# Patient Record
Sex: Female | Born: 1970 | Race: White | Hispanic: No | Marital: Married | Smoking: Former smoker
Health system: Southern US, Community
[De-identification: ages and names within clinical notes are randomized; demographics above are authoritative.]

## PROBLEM LIST (undated history)

## (undated) DIAGNOSIS — Z9889 Other specified postprocedural states: Secondary | ICD-10-CM

## (undated) DIAGNOSIS — R112 Nausea with vomiting, unspecified: Secondary | ICD-10-CM

## (undated) DIAGNOSIS — R51 Headache: Secondary | ICD-10-CM

## (undated) DIAGNOSIS — H8109 Meniere's disease, unspecified ear: Secondary | ICD-10-CM

## (undated) DIAGNOSIS — T8859XA Other complications of anesthesia, initial encounter: Secondary | ICD-10-CM

## (undated) DIAGNOSIS — T4145XA Adverse effect of unspecified anesthetic, initial encounter: Secondary | ICD-10-CM

## (undated) DIAGNOSIS — R519 Headache, unspecified: Secondary | ICD-10-CM

## (undated) DIAGNOSIS — N390 Urinary tract infection, site not specified: Secondary | ICD-10-CM

## (undated) DIAGNOSIS — M1990 Unspecified osteoarthritis, unspecified site: Secondary | ICD-10-CM

## (undated) DIAGNOSIS — R569 Unspecified convulsions: Secondary | ICD-10-CM

## (undated) HISTORY — PX: TONSILLECTOMY: SUR1361

## (undated) HISTORY — PX: OTHER SURGICAL HISTORY: SHX169

## (undated) HISTORY — PX: CERVICAL DISC SURGERY: SHX588

---

## 1998-03-24 ENCOUNTER — Ambulatory Visit (HOSPITAL_COMMUNITY): Admission: RE | Admit: 1998-03-24 | Discharge: 1998-03-24 | Payer: Self-pay | Admitting: Neurology

## 1999-08-28 ENCOUNTER — Other Ambulatory Visit: Admission: RE | Admit: 1999-08-28 | Discharge: 1999-08-28 | Payer: Self-pay | Admitting: Obstetrics and Gynecology

## 1999-10-30 ENCOUNTER — Encounter: Payer: Self-pay | Admitting: Obstetrics and Gynecology

## 1999-10-30 ENCOUNTER — Encounter: Admission: RE | Admit: 1999-10-30 | Discharge: 1999-10-30 | Payer: Self-pay | Admitting: Obstetrics and Gynecology

## 2000-01-03 ENCOUNTER — Encounter: Admission: RE | Admit: 2000-01-03 | Discharge: 2000-04-02 | Payer: Self-pay | Admitting: Obstetrics and Gynecology

## 2000-02-18 ENCOUNTER — Inpatient Hospital Stay (HOSPITAL_COMMUNITY): Admission: AD | Admit: 2000-02-18 | Discharge: 2000-02-18 | Payer: Self-pay | Admitting: Obstetrics and Gynecology

## 2000-03-01 ENCOUNTER — Inpatient Hospital Stay (HOSPITAL_COMMUNITY): Admission: AD | Admit: 2000-03-01 | Discharge: 2000-03-03 | Payer: Self-pay | Admitting: Obstetrics and Gynecology

## 2000-04-14 ENCOUNTER — Other Ambulatory Visit: Admission: RE | Admit: 2000-04-14 | Discharge: 2000-04-14 | Payer: Self-pay | Admitting: Obstetrics and Gynecology

## 2001-06-01 ENCOUNTER — Other Ambulatory Visit: Admission: RE | Admit: 2001-06-01 | Discharge: 2001-06-01 | Payer: Self-pay | Admitting: Obstetrics and Gynecology

## 2001-09-13 ENCOUNTER — Ambulatory Visit (HOSPITAL_COMMUNITY): Admission: RE | Admit: 2001-09-13 | Discharge: 2001-09-13 | Payer: Self-pay | Admitting: Family Medicine

## 2001-09-13 ENCOUNTER — Encounter: Payer: Self-pay | Admitting: Family Medicine

## 2002-11-07 ENCOUNTER — Other Ambulatory Visit: Admission: RE | Admit: 2002-11-07 | Discharge: 2002-11-07 | Payer: Self-pay | Admitting: Obstetrics and Gynecology

## 2002-11-15 ENCOUNTER — Encounter: Payer: Self-pay | Admitting: Thoracic Surgery

## 2002-11-17 ENCOUNTER — Encounter: Payer: Self-pay | Admitting: Thoracic Surgery

## 2002-11-17 ENCOUNTER — Ambulatory Visit (HOSPITAL_COMMUNITY): Admission: RE | Admit: 2002-11-17 | Discharge: 2002-11-19 | Payer: Self-pay | Admitting: Thoracic Surgery

## 2002-11-18 ENCOUNTER — Encounter: Payer: Self-pay | Admitting: Thoracic Surgery

## 2002-11-19 ENCOUNTER — Encounter: Payer: Self-pay | Admitting: Thoracic Surgery

## 2003-12-18 ENCOUNTER — Other Ambulatory Visit: Admission: RE | Admit: 2003-12-18 | Discharge: 2003-12-18 | Payer: Self-pay | Admitting: Obstetrics and Gynecology

## 2004-03-16 ENCOUNTER — Emergency Department (HOSPITAL_COMMUNITY): Admission: EM | Admit: 2004-03-16 | Discharge: 2004-03-16 | Payer: Self-pay | Admitting: Emergency Medicine

## 2006-11-05 ENCOUNTER — Encounter: Admission: RE | Admit: 2006-11-05 | Discharge: 2006-11-05 | Payer: Self-pay | Admitting: Family Medicine

## 2006-11-10 ENCOUNTER — Ambulatory Visit (HOSPITAL_COMMUNITY): Admission: RE | Admit: 2006-11-10 | Discharge: 2006-11-11 | Payer: Self-pay | Admitting: Neurological Surgery

## 2011-12-24 ENCOUNTER — Emergency Department (HOSPITAL_COMMUNITY)
Admission: EM | Admit: 2011-12-24 | Discharge: 2011-12-24 | Disposition: A | Discharge status: Home / Self Care | Payer: Self-pay | Source: Home / Self Care | Attending: Emergency Medicine | Admitting: Emergency Medicine

## 2011-12-24 ENCOUNTER — Encounter (HOSPITAL_COMMUNITY): Payer: Self-pay | Admitting: Emergency Medicine

## 2011-12-24 DIAGNOSIS — R300 Dysuria: Secondary | ICD-10-CM

## 2011-12-24 DIAGNOSIS — N3090 Cystitis, unspecified without hematuria: Secondary | ICD-10-CM

## 2011-12-24 HISTORY — DX: Urinary tract infection, site not specified: N39.0

## 2011-12-24 LAB — POCT URINALYSIS DIP (DEVICE)
Glucose, UA: NEGATIVE mg/dL
Hgb urine dipstick: NEGATIVE
Nitrite: NEGATIVE
Specific Gravity, Urine: >=1.03 (ref 1.005–1.030)

## 2011-12-24 MED ORDER — NITROFURANTOIN MONOHYD MACRO 100 MG PO CAPS: 100.0000 mg | ORAL_CAPSULE | Freq: Two times a day (BID) | ORAL | Status: AC

## 2011-12-24 MED ORDER — PHENAZOPYRIDINE HCL 200 MG PO TABS: 200.0000 mg | ORAL_TABLET | Freq: Three times a day (TID) | ORAL | Status: AC | PRN

## 2011-12-24 NOTE — Discharge Instructions (Signed)
Take the medication as written. Give [place] a working phone number so that we can contact you if needed. Finish the antibiotrics even if you feel better. Return if you get worse, have a fever >100.4, or for any concerns.   Go to www.goodrx.com to look up your medications. This will give you a list of where you can find your prescriptions at the most affordable prices.

## 2011-12-24 NOTE — ED Provider Notes (Signed)
History     CSN: [id number]  Arrival date & time 12/24/11  1804   First MD Initiated Contact with Patient 12/24/11 1847      Chief Complaint  Patient presents with  . Dysuria    (Consider location/radiation/quality/duration/timing/severity/associated sxs/prior treatment) HPI Comments: States her urine smelled stronger than usual. Denies any hematuria, cloudy urine. No vaginal blisters, vaginal discharge. Sexually active with husband, who is asymptomatic. STDs not a concern today. States this feels similar to previous UTIs. Has increased fluid intake and is taking Cystex with temporary relief.  Patient is a 41 y.o. female presenting with dysuria. The history is provided by the patient. No language interpreter was used.  Dysuria  This is a new problem. The current episode started more than 2 days ago. The problem occurs every urination. The problem has not changed since onset.The quality of the pain is described as burning and aching. There has been no fever. She is sexually active. There is no history of pyelonephritis. Associated symptoms include frequency and urgency. Pertinent negatives include no chills, no sweats, no nausea, no vomiting, no discharge, no hematuria, no hesitancy, no possible pregnancy and no flank pain. Treatments tried: Cystex. Her past medical history is significant for recurrent UTIs.    Past Medical History  Diagnosis Date  . UTI (lower urinary tract infection)     Past Surgical History  Procedure Date  . Tonsillectomy   . Partial discectomy   . Cervical disc surgery     History reviewed. No pertinent family history.  History  Substance Use Topics  . Smoking status: Never Smoker   . Smokeless tobacco: Not on file  . Alcohol Use: No    OB History    Grav Para Term Preterm Abortions TAB SAB Ect Mult Living                  Review of Systems  Constitutional: Negative for chills.  Gastrointestinal: Negative for nausea and vomiting.    Genitourinary: Positive for dysuria, urgency and frequency. Negative for hesitancy, hematuria and flank pain.    Allergies  Ceftin; Codeine; Morphine and related; and Sulfa antibiotics  Home Medications   Current Outpatient Rx  Name Route Sig Dispense Refill  . PROGESTERONE MICRONIZED 200 MG PO CAPS Oral Take 200 mg by mouth daily.    Marland Kitchen NITROFURANTOIN MONOHYD MACRO 100 MG PO CAPS Oral Take 1 capsule (100 mg total) by mouth 2 (two) times daily. 10 capsule 0  . PHENAZOPYRIDINE HCL 200 MG PO TABS Oral Take 1 tablet (200 mg total) by mouth 3 (three) times daily as needed for pain. 6 tablet 0    BP 118/71  Pulse 60  Temp(Src) 98.8 F (37.1 C) (Oral)  Resp 14  SpO2 100%  LMP 08/25/2011  Physical Exam  Nursing note and vitals reviewed. Constitutional: She is oriented to person, place, and time. She appears well-developed and well-nourished. No distress.  HENT:  Head: Normocephalic and atraumatic.  Eyes: Conjunctivae and EOM are normal.  Neck: Normal range of motion.  Cardiovascular: Normal rate.   Pulmonary/Chest: Effort normal.  Abdominal: Soft. Normal appearance and bowel sounds are normal. She exhibits no distension. There is tenderness in the suprapubic area. There is no rebound, no guarding and no CVA tenderness.  Musculoskeletal: Normal range of motion.  Neurological: She is alert and oriented to person, place, and time.  Skin: Skin is warm and dry.  Psychiatric: She has a normal mood and affect. Her behavior is normal. Judgment  and thought content normal.    ED Course  Procedures (including critical care time)  Labs Reviewed  POCT URINALYSIS DIP (DEVICE) - Abnormal; Notable for the following:    Bilirubin Urine SMALL (*)    Ketones, ur TRACE (*)    All other components within normal limits  POCT PREGNANCY, URINE  URINE CULTURE   No results found.   1. Cystitis   2. Dysuria    Results for orders placed during the hospital encounter of 12/24/11  POCT  URINALYSIS DIP (DEVICE)      Component Value Range   Glucose, UA NEGATIVE  NEGATIVE (mg/dL)   Bilirubin Urine SMALL (*) NEGATIVE    Ketones, ur TRACE (*) NEGATIVE (mg/dL)   Specific Gravity, Urine >=1.030  1.005 - 1.030    Hgb urine dipstick NEGATIVE  NEGATIVE    pH 5.5  5.0 - 8.0    Protein, ur NEGATIVE  NEGATIVE (mg/dL)   Urobilinogen, UA 0.2  0.0 - 1.0 (mg/dL)   Nitrite NEGATIVE  NEGATIVE    Leukocytes, UA NEGATIVE  NEGATIVE   POCT PREGNANCY, URINE      Component Value Range   Preg Test, Ur NEGATIVE  NEGATIVE       MDM  Udip noted. Patient symptomatic, with suprapubic tenderness, will treat as UTI. Sending off urine culture. Patient to give [place] working phone number so that we can contact her if we need to change her antibiotics. Patient agrees with plan. She is to followup with her primary care physician as needed.  Luiz Blare, MD 12/24/11 2011

## 2011-12-24 NOTE — ED Notes (Signed)
Pt with onset burning urination saturday

## 2011-12-25 LAB — URINE CULTURE
Culture: NO GROWTH
Special Requests: NORMAL

## 2011-12-26 ENCOUNTER — Telehealth (HOSPITAL_COMMUNITY): Payer: Self-pay | Admitting: *Deleted

## 2011-12-26 NOTE — ED Notes (Signed)
Pt called to check on urine culture results.  Urine culture shows no growth.  Pt states she has taken rx provided for her at visit but continues to have urinary discomfort.  Pt advised to come to [facility] or her her PCP for follow up.

## 2013-03-18 ENCOUNTER — Encounter (HOSPITAL_COMMUNITY): Payer: Self-pay | Admitting: Emergency Medicine

## 2013-03-18 ENCOUNTER — Emergency Department (HOSPITAL_COMMUNITY)
Admission: EM | Admit: 2013-03-18 | Discharge: 2013-03-18 | Disposition: A | Discharge status: Home / Self Care | Payer: 59 | Attending: Emergency Medicine | Admitting: Emergency Medicine

## 2013-03-18 DIAGNOSIS — Z79899 Other long term (current) drug therapy: Secondary | ICD-10-CM | POA: Insufficient documentation

## 2013-03-18 DIAGNOSIS — Z87440 Personal history of urinary (tract) infections: Secondary | ICD-10-CM | POA: Insufficient documentation

## 2013-03-18 DIAGNOSIS — H8110 Benign paroxysmal vertigo, unspecified ear: Secondary | ICD-10-CM

## 2013-03-18 DIAGNOSIS — E860 Dehydration: Secondary | ICD-10-CM | POA: Insufficient documentation

## 2013-03-18 DIAGNOSIS — R112 Nausea with vomiting, unspecified: Secondary | ICD-10-CM

## 2013-03-18 DIAGNOSIS — Z3202 Encounter for pregnancy test, result negative: Secondary | ICD-10-CM | POA: Insufficient documentation

## 2013-03-18 LAB — COMPREHENSIVE METABOLIC PANEL
Albumin: 3.6 g/dL (ref 3.5–5.2)
BUN: 12 mg/dL (ref 6–23)
Chloride: 106 mEq/L (ref 96–112)
Creatinine, Ser: 0.83 mg/dL (ref 0.50–1.10)
GFR calc non Af Amer: 87 mL/min — ABNORMAL LOW (ref >90)
Total Bilirubin: 1.3 mg/dL — ABNORMAL HIGH (ref 0.3–1.2)

## 2013-03-18 LAB — URINALYSIS, ROUTINE W REFLEX MICROSCOPIC
Ketones, ur: NEGATIVE mg/dL
Leukocytes, UA: NEGATIVE
Nitrite: NEGATIVE
Protein, ur: NEGATIVE mg/dL
Urobilinogen, UA: 1 mg/dL (ref 0.0–1.0)
pH: 5 (ref 5.0–8.0)

## 2013-03-18 LAB — LIPASE, BLOOD: Lipase: 20 U/L (ref 11–59)

## 2013-03-18 MED ORDER — MECLIZINE HCL 50 MG PO TABS: 50.0000 mg | ORAL_TABLET | Freq: Three times a day (TID) | ORAL | Status: DC | PRN

## 2013-03-18 MED ORDER — SODIUM CHLORIDE 0.9 % IV BOLUS (SEPSIS)
1000.0000 mL | Freq: Once | INTRAVENOUS | Status: AC
  Administered 2013-03-18: 1000 mL via INTRAVENOUS

## 2013-03-18 MED ORDER — ONDANSETRON HCL 4 MG/2ML IJ SOLN
4.0000 mg | Freq: Once | INTRAMUSCULAR | Status: AC
  Administered 2013-03-18: 4 mg via INTRAVENOUS
  Filled 2013-03-18: qty 2

## 2013-03-18 MED ORDER — ONDANSETRON 4 MG PO TBDP
4.0000 mg | ORAL_TABLET | Freq: Once | ORAL | Status: AC
  Administered 2013-03-18: 4 mg via ORAL
  Filled 2013-03-18: qty 1

## 2013-03-18 MED ORDER — ONDANSETRON HCL 4 MG PO TABS: 4.0000 mg | ORAL_TABLET | Freq: Four times a day (QID) | ORAL | Status: DC

## 2013-03-18 MED ORDER — MECLIZINE HCL 25 MG PO TABS
25.0000 mg | ORAL_TABLET | Freq: Once | ORAL | Status: AC
  Administered 2013-03-18: 25 mg via ORAL
  Filled 2013-03-18: qty 1

## 2013-03-18 MED ORDER — LORAZEPAM 2 MG/ML IJ SOLN
1.0000 mg | Freq: Once | INTRAMUSCULAR | Status: AC
  Administered 2013-03-18: 1 mg via INTRAVENOUS
  Filled 2013-03-18: qty 1

## 2013-03-18 NOTE — ED Notes (Signed)
Pt informed we still need a urine sample, pt states she is unable to go at this time.

## 2013-03-18 NOTE — ED Notes (Signed)
Pt given cup of ice chips 

## 2013-03-18 NOTE — ED Provider Notes (Signed)
Pt signed out to me by Dr. Lavella Kemp at shift change, see note.  Pt is a  42yo female, presented with classic vertiginous symptoms.  Tx with meclizine and fluids which provided some relief, pt still symptomatic.  UA is pending.  Pt is getting ativan to help with continued vertiginous symptoms.  Pt is to be discharged home and f/u with ENT once symptoms under control.  UA: nl.  Pt able to ambulate to restroom.  States she still feels some nausea but comfortable going home.  Rx: meclizine and zofran. Will discharge pt home and have her f/u with PCP, Dr. Pollyann Kennedy as needed.  Pt verbalized understanding and agreement with tx plan. Vitals: unremarkable. Discharged in stable condition.    Discussed pt with attending during ED encounter.   Junius Finner, PA-C 03/18/13 1108

## 2013-03-18 NOTE — ED Notes (Signed)
Per EMS pt has had N/V for the past two days. EMS adm a total of 8 mg of zofran ODT. Pt actively vomiting upon arrival to the ED. Pt is A&O.

## 2013-03-18 NOTE — ED Provider Notes (Signed)
History    CSN: [id number] Arrival date & time 03/18/13  0510  First MD Initiated Contact with Patient 03/18/13 0518     Chief Complaint  Patient presents with  . Emesis  . Nausea   (Consider location/radiation/quality/duration/timing/severity/associated sxs/prior Treatment) HPI This patient is a 42 year old woman in generally good health. She presents with 36 hours of severe vertigo associated with nausea and vomiting. The patient says that she is unable to open her eyes without feeling significantly nauseated. She has vomited in innumerable times. Her emesis has been nonbloody and nonbilious. She denies abdominal pain. She denies diarrhea. She had well formed bowel movement about 8 hours prior to arrival. She has a history of abdominal surgeries.  The patient denies headache, fever, tinnitus history of similar symptoms.  The patient's husband describes having observed or horizontal nystagmus at home the Past Medical History  Diagnosis Date  . UTI (lower urinary tract infection)    Past Surgical History  Procedure Laterality Date  . Tonsillectomy    . Partial discectomy    . Cervical disc surgery     No family history on file. History  Substance Use Topics  . Smoking status: Never Smoker   . Smokeless tobacco: Not on file  . Alcohol Use: No   OB History   Grav Para Term Preterm Abortions TAB SAB Ect Mult Living                 Review of Systems Gen: no weight loss, fevers, chills, night sweats Eyes: no discharge or drainage, no occular pain or visual changes Nose: no epistaxis or rhinorrhea Mouth: no dental pain, no sore throat Neck: no neck pain Lungs: no SOB, cough, wheezing CV: no chest pain, palpitations, dependent edema or orthopnea Abd: As per history of present illness, otherwise negative GU: no dysuria or gross hematuria MSK: no myalgias or arthralgias Neuro: As per history of present illness, otherwise negative Skin: no rash Psyche:  negative.  Allergies  Ceftin; Codeine; Morphine and related; and Sulfa antibiotics  Home Medications   Current Outpatient Rx  Name  Route  Sig  Dispense  Refill  . progesterone 50 MG/ML injection   Intramuscular   Inject 50 mg into the muscle every 7 (seven) days. For sundays          BP 103/57  Pulse 78  Resp 17  SpO2 100% Physical Exam Gen: well developed and well nourished appearing, ill appearing, laying still on gurney with eyes closed and seemingly reluctant to move her head Head: NCAT Eyes: PERL, EOMI Nose: no epistaixis or rhinorrhea Mouth/throat: mucosa is moist and pink Neck: supple, no stridor Lungs: CTA B, no wheezing, rhonchi or rales CV: RRR, no murmur Abd: soft, notender, nondistended Back: no ttp, no cva ttp Skin: no rashese, wnl Neuro: CN ii-xii grossly intact, horizontal nystagmus appreciated, no rotational or vertical nystagmus.  no focal deficits Psyche; normal affect,  calm and cooperative.   ED Course  Procedures (including critical care time)  Results for orders placed during the hospital encounter of 03/18/13 (from the past 24 hour(s))  COMPREHENSIVE METABOLIC PANEL     Status: Abnormal   Collection Time    03/18/13  5:30 AM      Result Value Range   Sodium 138  135 - 145 mEq/L   Potassium 3.7  3.5 - 5.1 mEq/L   Chloride 106  96 - 112 mEq/L   CO2 22  19 - 32 mEq/L   Glucose, Bld  166 (*) 70 - 99 mg/dL   BUN 12  6 - 23 mg/dL   Creatinine, Ser 1.61  0.50 - 1.10 mg/dL   Calcium 8.5  8.4 - 09.6 mg/dL   Total Protein 6.6  6.0 - 8.3 g/dL   Albumin 3.6  3.5 - 5.2 g/dL   AST 18  0 - 37 U/L   ALT 14  0 - 35 U/L   Alkaline Phosphatase 64  39 - 117 U/L   Total Bilirubin 1.3 (*) 0.3 - 1.2 mg/dL   GFR calc non Af Amer 87 (*) >90 mL/min   GFR calc Af Amer >90  >90 mL/min  LIPASE, BLOOD     Status: None   Collection Time    03/18/13  5:30 AM      Result Value Range   Lipase 20  11 - 59 U/L     MDM  Patient presents with sx consistent with  BPV.   She is feeling somewhat improved and is able to open her eyes without feeling ill after tx with 1st liter of NS and Meclizine 25mg  po. However, she says she still has vertigo. We will tx with Ativan and a 2nd liter of NS. Patient will then be re-evaluated for disposition with plan to refer to ENT for outpatient f/u.   Brandt Loosen, MD 03/18/13 [phone]

## 2013-03-20 NOTE — ED Provider Notes (Signed)
Medical screening examination/treatment/procedure(s) were performed by non-physician practitioner and as supervising physician I was immediately available for consultation/collaboration.   Brandt Loosen, MD 03/20/13 [phone]

## 2013-03-31 ENCOUNTER — Other Ambulatory Visit: Payer: Self-pay | Admitting: Physician Assistant

## 2013-03-31 DIAGNOSIS — R42 Dizziness and giddiness: Secondary | ICD-10-CM

## 2013-04-01 ENCOUNTER — Ambulatory Visit
Admission: RE | Admit: 2013-04-01 | Discharge: 2013-04-01 | Disposition: A | Discharge status: Home / Self Care | Payer: 59 | Source: Ambulatory Visit | Attending: Physician Assistant | Admitting: Physician Assistant

## 2013-04-01 DIAGNOSIS — R42 Dizziness and giddiness: Secondary | ICD-10-CM

## 2013-04-01 MED ORDER — GADOBENATE DIMEGLUMINE 529 MG/ML IV SOLN
17.0000 mL | Freq: Once | INTRAVENOUS | Status: AC | PRN
  Administered 2013-04-01: 17 mL via INTRAVENOUS

## 2013-09-20 ENCOUNTER — Other Ambulatory Visit: Payer: Self-pay

## 2013-09-20 DIAGNOSIS — Z1231 Encounter for screening mammogram for malignant neoplasm of breast: Secondary | ICD-10-CM

## 2013-10-03 ENCOUNTER — Ambulatory Visit
Admission: RE | Admit: 2013-10-03 | Discharge: 2013-10-03 | Disposition: A | Discharge status: Home / Self Care | Payer: 59 | Source: Ambulatory Visit

## 2013-10-03 DIAGNOSIS — Z1231 Encounter for screening mammogram for malignant neoplasm of breast: Secondary | ICD-10-CM

## 2015-10-05 ENCOUNTER — Ambulatory Visit: Payer: Self-pay | Admitting: Physician Assistant

## 2015-10-22 ENCOUNTER — Other Ambulatory Visit (HOSPITAL_COMMUNITY): Payer: Self-pay | Admitting: *Deleted

## 2015-10-22 NOTE — Pre-Procedure Instructions (Signed)
    KENNY STERN  10/22/2015      [id number] [id number]:: [phone]:: [phone]   Your procedure is scheduled on Wednesday October 31, 2015.  [facility]  Call this number if you have problems the morning of surgery:  [phone]   Remember:  Do not eat food or drink liquids after midnight.  Take these medicines the morning of surgery with A SIP OF WATER: cyclobenzaprine (Flexeril), fluticasone (Flonase), tramadol (Ultram) if needed.   Do not wear jewelry, make-up or nail polish.  Do not wear lotions, powders, or perfumes.  You may not wear deodorant.  Do not shave 48 hours prior to surgery.    Do not bring valuables to the hospital.  [facility] belongings or [facility]ables.  Contacts, dentures or bridgework may not be worn into surgery.  Leave your suitcase in the car.  After surgery it may be brought to your room.  For patients admitted to the hospital, discharge time will be determined by your treatment team.  Patients discharged the day of surgery will not be allowed to drive home.    Special instructions:  Please see attached CHG shower instructions sheet.  Please read over the following fact sheets that you were given. Pain Booklet, Coughing and Deep Breathing, MRSA Information and Surgical Site Infection Prevention

## 2015-10-23 ENCOUNTER — Encounter (HOSPITAL_COMMUNITY): Payer: Self-pay

## 2015-10-23 ENCOUNTER — Encounter (HOSPITAL_COMMUNITY)
Admission: RE | Admit: 2015-10-23 | Discharge: 2015-10-23 | Disposition: A | Discharge status: Home / Self Care | Payer: 59 | Source: Ambulatory Visit | Attending: Orthopedic Surgery | Admitting: Orthopedic Surgery

## 2015-10-23 DIAGNOSIS — Z01818 Encounter for other preprocedural examination: Secondary | ICD-10-CM | POA: Diagnosis present

## 2015-10-23 DIAGNOSIS — Z01812 Encounter for preprocedural laboratory examination: Secondary | ICD-10-CM | POA: Insufficient documentation

## 2015-10-23 DIAGNOSIS — M50221 Other cervical disc displacement at C4-C5 level: Secondary | ICD-10-CM | POA: Diagnosis not present

## 2015-10-23 HISTORY — DX: Unspecified convulsions: R56.9

## 2015-10-23 LAB — CBC
HCT: 41.6 % (ref 36.0–46.0)
Hemoglobin: 14.4 g/dL (ref 12.0–15.0)
MCH: 32.1 pg (ref 26.0–34.0)
MCHC: 34.6 g/dL (ref 30.0–36.0)
MCV: 92.7 fL (ref 78.0–100.0)
PLATELETS: 221 10*3/uL (ref 150–400)
RBC: 4.49 MIL/uL (ref 3.87–5.11)
RDW: 12.3 % (ref 11.5–15.5)
WBC: 5.9 10*3/uL (ref 4.0–10.5)

## 2015-10-23 LAB — SURGICAL PCR SCREEN
MRSA, PCR: NEGATIVE
Staphylococcus aureus: NEGATIVE

## 2015-10-23 LAB — HCG, SERUM, QUALITATIVE: Preg, Serum: NEGATIVE

## 2015-10-24 NOTE — Progress Notes (Signed)
Anesthesia Chart Review:  Pt is a 45 year old female scheduled for C4-5 anterior cervical disc arthroplasty on 10/31/2015 with Dr. Rolena Infante.   PMH includes:  Seizures (once many years ago). Never smoker. BMI 29  Preoperative labs reviewed.    Chart reviewed per anesthesia consult order from Dr. Rolena Infante.   If no changes, I anticipate pt can proceed with surgery as scheduled.   Willeen Cass, FNP-BC [facility]/Anesthesiology Phone: [phone] 10/24/2015 11:13 AM

## 2015-10-31 ENCOUNTER — Ambulatory Visit (HOSPITAL_COMMUNITY): Payer: 59

## 2015-10-31 ENCOUNTER — Ambulatory Visit (HOSPITAL_COMMUNITY): Payer: 59 | Admitting: Emergency Medicine

## 2015-10-31 ENCOUNTER — Encounter (HOSPITAL_COMMUNITY): Payer: Self-pay | Admitting: Anesthesiology

## 2015-10-31 ENCOUNTER — Ambulatory Visit (HOSPITAL_COMMUNITY): Payer: 59 | Admitting: Anesthesiology

## 2015-10-31 ENCOUNTER — Observation Stay (HOSPITAL_COMMUNITY): Payer: 59

## 2015-10-31 ENCOUNTER — Encounter (HOSPITAL_COMMUNITY)
Admission: RE | Disposition: A | Discharge status: Home / Self Care | Payer: Self-pay | Source: Ambulatory Visit | Attending: Orthopedic Surgery

## 2015-10-31 ENCOUNTER — Observation Stay (HOSPITAL_COMMUNITY)
Admission: RE | Admit: 2015-10-31 | Discharge: 2015-11-01 | Disposition: A | Discharge status: Home / Self Care | Payer: 59 | Source: Ambulatory Visit | Attending: Orthopedic Surgery | Admitting: Orthopedic Surgery

## 2015-10-31 DIAGNOSIS — Z882 Allergy status to sulfonamides status: Secondary | ICD-10-CM | POA: Diagnosis not present

## 2015-10-31 DIAGNOSIS — R569 Unspecified convulsions: Secondary | ICD-10-CM | POA: Insufficient documentation

## 2015-10-31 DIAGNOSIS — M542 Cervicalgia: Secondary | ICD-10-CM | POA: Diagnosis present

## 2015-10-31 DIAGNOSIS — G43909 Migraine, unspecified, not intractable, without status migrainosus: Secondary | ICD-10-CM | POA: Insufficient documentation

## 2015-10-31 DIAGNOSIS — Z9889 Other specified postprocedural states: Secondary | ICD-10-CM

## 2015-10-31 DIAGNOSIS — M50121 Cervical disc disorder at C4-C5 level with radiculopathy: Principal | ICD-10-CM | POA: Insufficient documentation

## 2015-10-31 DIAGNOSIS — Z888 Allergy status to other drugs, medicaments and biological substances status: Secondary | ICD-10-CM | POA: Diagnosis not present

## 2015-10-31 DIAGNOSIS — Z885 Allergy status to narcotic agent status: Secondary | ICD-10-CM | POA: Diagnosis not present

## 2015-10-31 DIAGNOSIS — Z419 Encounter for procedure for purposes other than remedying health state, unspecified: Secondary | ICD-10-CM

## 2015-10-31 DIAGNOSIS — Z87891 Personal history of nicotine dependence: Secondary | ICD-10-CM | POA: Diagnosis not present

## 2015-10-31 HISTORY — PX: CERVICAL DISC ARTHROPLASTY: SHX587

## 2015-10-31 SURGERY — CERVICAL ANTERIOR DISC ARTHROPLASTY
Anesthesia: General | Laterality: Right

## 2015-10-31 MED ORDER — LACTATED RINGERS IV SOLN: INTRAVENOUS | Status: DC

## 2015-10-31 MED ORDER — LACTATED RINGERS IV SOLN
INTRAVENOUS | Status: DC | PRN
  Administered 2015-10-31 (×2): via INTRAVENOUS

## 2015-10-31 MED ORDER — ONDANSETRON HCL 4 MG/2ML IJ SOLN: 4.0000 mg | INTRAMUSCULAR | Status: DC | PRN

## 2015-10-31 MED ORDER — OXYCODONE HCL 5 MG PO TABS: 10.0000 mg | ORAL_TABLET | ORAL | Status: DC | PRN

## 2015-10-31 MED ORDER — ONDANSETRON HCL 4 MG PO TABS: 4.0000 mg | ORAL_TABLET | Freq: Three times a day (TID) | ORAL | Status: DC | PRN

## 2015-10-31 MED ORDER — BUPIVACAINE-EPINEPHRINE (PF) 0.25% -1:200000 IJ SOLN
INTRAMUSCULAR | Status: AC
  Filled 2015-10-31: qty 30

## 2015-10-31 MED ORDER — ROCURONIUM BROMIDE 50 MG/5ML IV SOLN
INTRAVENOUS | Status: AC
  Filled 2015-10-31: qty 1

## 2015-10-31 MED ORDER — PHENOL 1.4 % MT LIQD: 1.0000 | OROMUCOSAL | Status: DC | PRN

## 2015-10-31 MED ORDER — VANCOMYCIN HCL IN DEXTROSE 1-5 GM/200ML-% IV SOLN
INTRAVENOUS | Status: AC
  Filled 2015-10-31: qty 200

## 2015-10-31 MED ORDER — SODIUM CHLORIDE 0.9 % IV SOLN: 250.0000 mL | INTRAVENOUS | Status: DC

## 2015-10-31 MED ORDER — HYDROMORPHONE HCL 1 MG/ML IJ SOLN: 1.0000 mg | INTRAMUSCULAR | Status: DC | PRN

## 2015-10-31 MED ORDER — DIPHENHYDRAMINE HCL 25 MG PO CAPS
25.0000 mg | ORAL_CAPSULE | ORAL | Status: DC | PRN
Start: 2015-10-31 — End: 2015-11-01

## 2015-10-31 MED ORDER — FENTANYL CITRATE (PF) 250 MCG/5ML IJ SOLN
INTRAMUSCULAR | Status: AC
  Filled 2015-10-31: qty 5

## 2015-10-31 MED ORDER — DEXAMETHASONE SODIUM PHOSPHATE 4 MG/ML IJ SOLN: 4.0000 mg | Freq: Four times a day (QID) | INTRAMUSCULAR | Status: DC

## 2015-10-31 MED ORDER — OXYCODONE-ACETAMINOPHEN 5-325 MG PO TABS
1.0000 | ORAL_TABLET | ORAL | Status: DC | PRN
  Administered 2015-10-31 – 2015-11-01 (×4): 2 via ORAL
  Filled 2015-10-31 (×4): qty 2

## 2015-10-31 MED ORDER — MIDAZOLAM HCL 5 MG/5ML IJ SOLN
INTRAMUSCULAR | Status: DC | PRN
  Administered 2015-10-31: 2 mg via INTRAVENOUS

## 2015-10-31 MED ORDER — HEMOSTATIC AGENTS (NO CHARGE) OPTIME
TOPICAL | Status: DC | PRN
  Administered 2015-10-31 (×2): 1 via TOPICAL

## 2015-10-31 MED ORDER — OXYCODONE-ACETAMINOPHEN 10-325 MG PO TABS: 1.0000 | ORAL_TABLET | ORAL | Status: DC | PRN

## 2015-10-31 MED ORDER — PROPOFOL 10 MG/ML IV BOLUS
INTRAVENOUS | Status: AC
  Filled 2015-10-31: qty 40

## 2015-10-31 MED ORDER — HYDROMORPHONE HCL 1 MG/ML IJ SOLN
0.2500 mg | INTRAMUSCULAR | Status: DC | PRN
  Administered 2015-10-31: 0.5 mg via INTRAVENOUS
  Administered 2015-10-31 (×2): 0.25 mg via INTRAVENOUS
  Administered 2015-10-31: 0.5 mg via INTRAVENOUS

## 2015-10-31 MED ORDER — VANCOMYCIN HCL IN DEXTROSE 1-5 GM/200ML-% IV SOLN
1000.0000 mg | INTRAVENOUS | Status: AC
  Administered 2015-10-31: 1000 mg via INTRAVENOUS

## 2015-10-31 MED ORDER — MENTHOL 3 MG MT LOZG
1.0000 | LOZENGE | OROMUCOSAL | Status: DC | PRN
  Filled 2015-10-31: qty 9

## 2015-10-31 MED ORDER — ONDANSETRON HCL 4 MG/2ML IJ SOLN: 4.0000 mg | Freq: Once | INTRAMUSCULAR | Status: DC | PRN

## 2015-10-31 MED ORDER — LIDOCAINE HCL (CARDIAC) 20 MG/ML IV SOLN
INTRAVENOUS | Status: DC | PRN
  Administered 2015-10-31: 100 mg via INTRAVENOUS

## 2015-10-31 MED ORDER — ASPIRIN EC 81 MG PO TBEC: 81.0000 mg | DELAYED_RELEASE_TABLET | Freq: Every day | ORAL | Status: DC

## 2015-10-31 MED ORDER — MEPERIDINE HCL 25 MG/ML IJ SOLN: 6.2500 mg | INTRAMUSCULAR | Status: DC | PRN

## 2015-10-31 MED ORDER — ONDANSETRON HCL 4 MG/2ML IJ SOLN
INTRAMUSCULAR | Status: AC
  Filled 2015-10-31: qty 2

## 2015-10-31 MED ORDER — SUGAMMADEX SODIUM 200 MG/2ML IV SOLN
INTRAVENOUS | Status: DC | PRN
  Administered 2015-10-31: 200 mg via INTRAVENOUS

## 2015-10-31 MED ORDER — PROGESTERONE MICRONIZED 100 MG PO CAPS
100.0000 mg | ORAL_CAPSULE | Freq: Every day | ORAL | Status: DC
  Administered 2015-10-31: 100 mg via ORAL
  Filled 2015-10-31: qty 1

## 2015-10-31 MED ORDER — MIDAZOLAM HCL 2 MG/2ML IJ SOLN
INTRAMUSCULAR | Status: AC
  Filled 2015-10-31: qty 2

## 2015-10-31 MED ORDER — BUPIVACAINE-EPINEPHRINE 0.25% -1:200000 IJ SOLN
INTRAMUSCULAR | Status: DC | PRN
  Administered 2015-10-31: 7 mL

## 2015-10-31 MED ORDER — ROCURONIUM BROMIDE 100 MG/10ML IV SOLN
INTRAVENOUS | Status: DC | PRN
  Administered 2015-10-31: 50 mg via INTRAVENOUS

## 2015-10-31 MED ORDER — TRAMADOL HCL 50 MG PO TABS
50.0000 mg | ORAL_TABLET | Freq: Four times a day (QID) | ORAL | Status: DC | PRN
Start: 2015-10-31 — End: 2015-11-01

## 2015-10-31 MED ORDER — SODIUM CHLORIDE 0.9% FLUSH
3.0000 mL | Freq: Two times a day (BID) | INTRAVENOUS | Status: DC
  Administered 2015-10-31: 3 mL via INTRAVENOUS

## 2015-10-31 MED ORDER — PROPOFOL 10 MG/ML IV BOLUS
INTRAVENOUS | Status: DC | PRN
  Administered 2015-10-31: 200 mg via INTRAVENOUS

## 2015-10-31 MED ORDER — TRAMADOL HCL 50 MG PO TABS
50.0000 mg | ORAL_TABLET | Freq: Four times a day (QID) | ORAL | Status: DC
  Administered 2015-10-31: 50 mg via ORAL
  Filled 2015-10-31: qty 1

## 2015-10-31 MED ORDER — 0.9 % SODIUM CHLORIDE (POUR BTL) OPTIME
TOPICAL | Status: DC | PRN
  Administered 2015-10-31: 1000 mL

## 2015-10-31 MED ORDER — SUGAMMADEX SODIUM 200 MG/2ML IV SOLN
INTRAVENOUS | Status: AC
  Filled 2015-10-31: qty 2

## 2015-10-31 MED ORDER — THROMBIN 20000 UNITS EX SOLR
CUTANEOUS | Status: AC
  Filled 2015-10-31: qty 20000

## 2015-10-31 MED ORDER — METHOCARBAMOL 500 MG PO TABS: 500.0000 mg | ORAL_TABLET | Freq: Three times a day (TID) | ORAL | Status: DC | PRN

## 2015-10-31 MED ORDER — EPHEDRINE SULFATE 50 MG/ML IJ SOLN
INTRAMUSCULAR | Status: DC | PRN
  Administered 2015-10-31: 10 mg via INTRAVENOUS

## 2015-10-31 MED ORDER — DEXAMETHASONE SODIUM PHOSPHATE 10 MG/ML IJ SOLN
INTRAMUSCULAR | Status: AC
  Filled 2015-10-31: qty 1

## 2015-10-31 MED ORDER — THROMBIN 20000 UNITS EX SOLR
CUTANEOUS | Status: DC | PRN
  Administered 2015-10-31: 20000 [IU] via TOPICAL

## 2015-10-31 MED ORDER — METHOCARBAMOL 500 MG PO TABS
500.0000 mg | ORAL_TABLET | Freq: Four times a day (QID) | ORAL | Status: DC | PRN
  Administered 2015-10-31 – 2015-11-01 (×2): 500 mg via ORAL
  Filled 2015-10-31 (×3): qty 1

## 2015-10-31 MED ORDER — VANCOMYCIN HCL IN DEXTROSE 1-5 GM/200ML-% IV SOLN
1000.0000 mg | Freq: Once | INTRAVENOUS | Status: AC
Start: 2015-10-31 — End: 2015-10-31
  Administered 2015-10-31: 1000 mg via INTRAVENOUS
  Filled 2015-10-31: qty 200

## 2015-10-31 MED ORDER — DEXAMETHASONE 4 MG PO TABS
4.0000 mg | ORAL_TABLET | Freq: Four times a day (QID) | ORAL | Status: DC
  Administered 2015-10-31 – 2015-11-01 (×4): 4 mg via ORAL
  Filled 2015-10-31 (×4): qty 1

## 2015-10-31 MED ORDER — FENTANYL CITRATE (PF) 100 MCG/2ML IJ SOLN
INTRAMUSCULAR | Status: DC | PRN
  Administered 2015-10-31 (×2): 50 ug via INTRAVENOUS
  Administered 2015-10-31: 100 ug via INTRAVENOUS
  Administered 2015-10-31 (×3): 50 ug via INTRAVENOUS

## 2015-10-31 MED ORDER — SODIUM CHLORIDE 0.9% FLUSH: 3.0000 mL | INTRAVENOUS | Status: DC | PRN

## 2015-10-31 MED ORDER — METHOCARBAMOL 1000 MG/10ML IJ SOLN
500.0000 mg | Freq: Four times a day (QID) | INTRAVENOUS | Status: DC | PRN
  Administered 2015-10-31: 500 mg via INTRAVENOUS
  Filled 2015-10-31 (×2): qty 5

## 2015-10-31 MED ORDER — ONDANSETRON HCL 4 MG/2ML IJ SOLN
INTRAMUSCULAR | Status: DC | PRN
  Administered 2015-10-31: 4 mg via INTRAVENOUS

## 2015-10-31 MED ORDER — DEXAMETHASONE SODIUM PHOSPHATE 4 MG/ML IJ SOLN
INTRAMUSCULAR | Status: DC | PRN
  Administered 2015-10-31: 10 mg via INTRAVENOUS

## 2015-10-31 MED ORDER — HYDROMORPHONE HCL 1 MG/ML IJ SOLN
INTRAMUSCULAR | Status: AC
  Filled 2015-10-31: qty 1

## 2015-10-31 SURGICAL SUPPLY — 59 items
APL SKNCLS STERI-STRIP NONHPOA (GAUZE/BANDAGES/DRESSINGS) ×1
BENZOIN TINCTURE PRP APPL 2/3 (GAUZE/BANDAGES/DRESSINGS) ×1 IMPLANT
BIT MILLING PRODISC 2.0 STER (BIT) ×1 IMPLANT
BLADE SURG ROTATE 9660 (MISCELLANEOUS) IMPLANT
BUR EGG ELITE 4.0 (BURR) IMPLANT
BUR MATCHSTICK NEURO 3.0 LAGG (BURR) IMPLANT
CANISTER SUCTION 2500CC (MISCELLANEOUS) ×2 IMPLANT
CLSR STERI-STRIP ANTIMIC 1/2X4 (GAUZE/BANDAGES/DRESSINGS) ×2 IMPLANT
COVER MAYO STAND STRL (DRAPES) ×4 IMPLANT
COVER SURGICAL LIGHT HANDLE (MISCELLANEOUS) ×4 IMPLANT
CRADLE DONUT ADULT HEAD (MISCELLANEOUS) ×2 IMPLANT
DISC PRODISC-C MED DEEP 6MM (Neuro Prosthesis/Implant) ×1 IMPLANT
DRAPE C-ARM 42X72 X-RAY (DRAPES) ×2 IMPLANT
DRAPE C-ARMOR (DRAPES) ×2 IMPLANT
DRAPE POUCH INSTRU U-SHP 10X18 (DRAPES) ×2 IMPLANT
DRAPE SURG 17X23 STRL (DRAPES) ×2 IMPLANT
DRAPE U-SHAPE 47X51 STRL (DRAPES) ×2 IMPLANT
DRSG MEPILEX BORDER 4X4 (GAUZE/BANDAGES/DRESSINGS) ×2 IMPLANT
DURAPREP 6ML APPLICATOR 50/CS (WOUND CARE) ×2 IMPLANT
ELECT COATED BLADE 2.86 ST (ELECTRODE) ×2 IMPLANT
ELECT PENCIL ROCKER SW 15FT (MISCELLANEOUS) ×2 IMPLANT
ELECT REM PT RETURN 9FT ADLT (ELECTROSURGICAL) ×2
ELECTRODE REM PT RTRN 9FT ADLT (ELECTROSURGICAL) ×1 IMPLANT
GLOVE BIO SURGEON STRL SZ 6.5 (GLOVE) ×2 IMPLANT
GLOVE BIOGEL PI IND STRL 6.5 (GLOVE) ×1 IMPLANT
GLOVE BIOGEL PI IND STRL 8.5 (GLOVE) ×1 IMPLANT
GLOVE BIOGEL PI INDICATOR 6.5 (GLOVE) ×3
GLOVE BIOGEL PI INDICATOR 8.5 (GLOVE) ×1
GLOVE SS BIOGEL STRL SZ 8.5 (GLOVE) ×1 IMPLANT
GLOVE SUPERSENSE BIOGEL SZ 8.5 (GLOVE) ×2
GOWN STRL REUS W/ TWL LRG LVL3 (GOWN DISPOSABLE) ×1 IMPLANT
GOWN STRL REUS W/TWL 2XL LVL3 (GOWN DISPOSABLE) ×4 IMPLANT
GOWN STRL REUS W/TWL LRG LVL3 (GOWN DISPOSABLE) ×2
KIT BASIN OR (CUSTOM PROCEDURE TRAY) ×2 IMPLANT
KIT ROOM TURNOVER OR (KITS) ×2 IMPLANT
NDL SPNL 18GX3.5 QUINCKE PK (NEEDLE) ×1 IMPLANT
NEEDLE SPNL 18GX3.5 QUINCKE PK (NEEDLE) ×2 IMPLANT
NS IRRIG 1000ML POUR BTL (IV SOLUTION) ×2 IMPLANT
PACK ORTHO CERVICAL (CUSTOM PROCEDURE TRAY) ×2 IMPLANT
PACK UNIVERSAL I (CUSTOM PROCEDURE TRAY) ×2 IMPLANT
PAD ARMBOARD 7.5X6 YLW CONV (MISCELLANEOUS) ×4 IMPLANT
PATTIES SURGICAL .25X.25 (GAUZE/BANDAGES/DRESSINGS) ×1 IMPLANT
RESTRAINT LIMB HOLDER UNIV (RESTRAINTS) ×2 IMPLANT
SPONGE INTESTINAL PEANUT (DISPOSABLE) IMPLANT
SPONGE SURGIFOAM ABS GEL 100 (HEMOSTASIS) ×2 IMPLANT
SURGIFLO W/THROMBIN 8M KIT (HEMOSTASIS) ×2 IMPLANT
SUT BONE WAX W31G (SUTURE) ×2 IMPLANT
SUT MNCRL AB 3-0 PS2 27 (SUTURE) ×2 IMPLANT
SUT SILK 3 0 TIES 17X18 (SUTURE) ×2
SUT SILK 3-0 18XBRD TIE BLK (SUTURE) ×1 IMPLANT
SUT VIC AB 2-0 CT1 18 (SUTURE) ×2 IMPLANT
SYR BULB IRRIGATION 50ML (SYRINGE) ×2 IMPLANT
SYR CONTROL 10ML LL (SYRINGE) IMPLANT
TAPE CLOTH 4X10 WHT NS (GAUZE/BANDAGES/DRESSINGS) ×2 IMPLANT
TAPE UMBILICAL COTTON 1/8X30 (MISCELLANEOUS) ×2 IMPLANT
TIP INSERTER MED DEEP IMPL 16 (Orthopedic Implant) ×1 IMPLANT
TOWEL OR 17X24 6PK STRL BLUE (TOWEL DISPOSABLE) ×2 IMPLANT
TOWEL OR 17X26 10 PK STRL BLUE (TOWEL DISPOSABLE) ×2 IMPLANT
WATER STERILE IRR 1000ML POUR (IV SOLUTION) ×2 IMPLANT

## 2015-10-31 NOTE — Discharge Instructions (Signed)
Today you will be discharged from the hospital.  The purpose of the following handout is to help guide you over the next 2 weeks.  First and foremost, be sure you have a follow up appointment with Dr. Rolena Infante 2 weeks from the time of your surgery to have your sutures removed.  Please call [place] [phone] to schedule or confirm this appointment.      Brace You do not have to wear the collar while lying in bed or sitting in a high-backed chair, eating, sleeping or showering.  Other than these instances, you must wear the brace.  You may NOT wear the collar while driving a vehicle (see driving restrictions below).  It is advisable that you wear the collar in public places or while traveling in a car as a passenger.  Dr. Rolena Infante will discuss further use of the collar at your 2 week postop visit.  Wound Care You may SHOWER 5 days from the date of surgery.  Shower directly over the steri-strips.  DO NOT scrub or submerge (bath tub, swimming pool, hot tub, etc.) the area.  Pat to dry following your shower.  There is no need for additional dressings other than the steri-strips.  Allow the steri-strips to fall off on their own.  Once the strips have fallen off, you may leave the area undressed.  DO NOT apply lotion/cream/ointment to the area.  The wound must remain dry at all times other than while showering.  Dr. Rolena Infante or his staff will remove your stiches at your first postop visit and give you additional instructions regarding wound care at that time.   Activity NO DRIVING FOR 2 WEEKS.  No lifting over 5 pounds (approximately a gallon of milk).  No bending, stooping, squatting or twisting.  No overhead activities.  We encourage you to walk (short distances and often throughout the day) as you can tolerate.  A good rule of thumb is to get up and move once or twice every hour.  You may go up and down stairs carefully.  As you continue to recover, Dr. Rolena Infante will address and  adjust restrictions to your activities until no further restrictions are needed.  However, until your first postop visit, when Dr. Rolena Infante can assess your recovery, you are to follow these instructions.  At the end of this document is a tentative outline of activities for up to 1 year.       Medication You will be discharged from the hospital with medication for pain, spasm, nausea and constipation.  You will be given enough medication to last until your first postop visit in 2 weeks.  Medications WILL NOT BE REFILLED EARLY; therefore, you are to take the medications only as directed.  If you have been given multiple prescriptions, please leave them with your pharmacy.  They can keep them on file for when you need them.  Medications that are lost or stolen WILL NOT be replaced.  We will address the need for continuing certain medications on an individual basis during your postop visit.  We ask that you avoid over the counter anti-inflammatory medications (Advil, Aleve, Motrin) for 3 months.    What you can expect following neck surgery... It is not uncommon to experience a sore throat or difficulty swallowing following neck surgery.  Cold liquids and soft foods are helpful in soothing this discomfort.  There is no specific diet that you are to follow after surgery, however, there are a  few things you should keep in mind to avoid unneeded discomfort.  Take small bites and eat slowly.  Chew your food thoroughly before swallowing.   It is not uncommon to experience incisional soreness or pain in the back of the neck, shoulders or between the shoulder blades.  These symptoms will slowly begin to resolve as you continue to recover, however, they can last for a few weeks.    It is not uncommon to experience INTERMITTENT arm pain following surgery.  This pain can mimic the arm pain you had prior to surgery.  As long as the pain resolves on its own and is not constant, there is no need to become alarmed.    When To Call If you experience fever >101F, loss of bowel or bladder control, painful swelling in the lower extremities, constant (unresolving) arm pain.  If you experience any of these symptoms, please call [place] [phone].  What's Next As mentioned earlier, you will follow up with Dr. Rolena Infante in 2 weeks.  At that time, we will likely remove your stitches and discuss additional aspects of your recovery.                   ACTIVITY GUIDELINES ANTERIOR CERVICAL DISECTOMY AND FUSION  Activity Discharge 2 weeks 6 weeks 3 months 6 months 1 year  Shower 5 days        Submerge the wound  no no yes     Walking outdoors yes       Lifting 5 lbs yes       Climbing stairs yes       Cooking yes       Car rides (less than 30 minutes) yes       Car rides (greater than 30 minutes) no varies yes     Air travel no varies yes     Short outings [employer], visits, etc...) yes       School no no yes     Driving a car no no varies yes    Light upper extremity exercises no no varies yes    Stationary bike no no yes     Swimming (no diving) no no no varies yes   Vacuuming, laundry, mopping no no no varies yes   Biking outdoors no no no no varies yes  Light jogging no no no varies yes   Low impact aerobics no no no varies yes   Non-contact sports (tennis, golf) no no no varies yes   Hunting (no tree climbing) no no no varies yes   Dancing (non-gymnastics) no no no varies yes   Down-hill skiing (experienced skier) no no no no yes   Down-hill skiing (novice) no no no no yes   Cross-country skiing no no no no yes   Horseback riding (noncompetitive)  no no no no yes   Horseback riding (competitive) no no no no varies yes  Gardening/landscaping no no no varies yes   House repairs no no no varies varies yes  Lifting up to 50 lbs no no no no varies yes

## 2015-10-31 NOTE — Anesthesia Postprocedure Evaluation (Signed)
Anesthesia Post Note  Patient: Monica Kemp  Procedure(s) Performed: Procedure(s) (LRB): CERVICAL ANTERIOR DISC ARTHROPLASTY RIGHT C4 - C5 (Right)  Patient location during evaluation: PACU Anesthesia Type: General Level of consciousness: awake and alert Pain management: pain level controlled Vital Signs Assessment: post-procedure vital signs reviewed and stable Respiratory status: spontaneous breathing, nonlabored ventilation, respiratory function stable and patient connected to nasal cannula oxygen Cardiovascular status: blood pressure returned to baseline and stable Postop Assessment: no signs of nausea or vomiting Anesthetic complications: no    Last Vitals:  Filed Vitals:   10/31/15 1330 10/31/15 1426  BP: 114/75 135/81  Pulse: 94 94  Temp:  36.6 C  Resp: 19 18    Last Pain:  Filed Vitals:   10/31/15 1428  PainSc: 6                  [name] DAVID

## 2015-10-31 NOTE — H&P (Signed)
History of Present Illness  The patient is a 45 year old female who comes in today for a preoperative History and Physical. The patient is scheduled for a TDR C4-5 to be performed by Dr. Duane Lope D. Rolena Infante, MD at [facility] on 10-31-15 . Please see the hospital record for complete dictated history and physical. Severe cervical pain. radicular pain into her right upper extremity.  Additional reasons for visit:  Transition into care is described as the following: The patient is transitioning into care and a summary of care was reviewed.   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 Cerebrovascular Accident  Maternal Grandfather. Congestive Heart Failure  Mother. Diabetes Mellitus  Maternal Grandfather. Hypertension  Maternal Grandfather. Other medical problems  Mother is a patient of Dr. 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 09/11/2015: smoke(d) 1/2 pack(s) per day  Medication History  TraMADol HCl (50MG Tablet, 1 (one) Tablet Oral PO BID, Taken starting 09/28/2015) Active. Flexeril (10MG Tablet, Oral) Active. ("I take only on  the weekend") Relpax (20MG Tablet, Oral) Active. (10 mg prn migranes) Flonase Allergy Relief (50MCG/ACT Suspension, Nasal) Active. (qd) Progesterone Micronized (100MG Capsule, Oral) Active. (qhs) Vagifem (10MCG Tablet, Vaginal) Active. (2 x week) Multi Vitamin Daily (Oral) Active. (qd) Vitamin B-12 (1000MCG Tablet, Oral) Active. (qd) Vitamin C (100MG Tablet, Oral) Active. (qd) Magnesium (200MG Tablet, Oral) Active. (qd) CoQ10 (100MG Capsule, Oral) Active. (qd) Fish Oil (1000MG Capsule, Oral) Active. (qd) Medications Reconciled  Past Surgical History Neck Disc Surgery  Tonsillectomy   Other Problems  Migraine Headache  Other disease, cancer, significant illness  Vertigo  Vitals  10/16/2015 8:52 AM Weight: 192 lb Height: 68.5in Body Surface Area: 2.02 m Body Mass Index: 28.77 kg/m  Temp.: 98.4F(Oral)  Pulse: 78 (Regular)  BP: 126/88 (Sitting, Left Arm, Standard)  Physical Exam  General General Appearance-Not in acute distress. Orientation-Oriented X3. Build & Nutrition-Well nourished and Well developed.  Integumentary General Characteristics Surgical Scars - anterior neck surgical scarring consistent with previous cervical surgery. Cervical Spine-Skin examination of the cervical spine is without deformity, skin lesions, lacerations or abrasions.  Chest and Lung Exam Auscultation Breath sounds - Normal and Clear.  Cardiovascular Auscultation Rhythm - Regular rate and rhythm.  Abdomen Palpation/Percussion Palpation and Percussion of the abdomen reveal - Soft and Non Tender.  Peripheral Vascular Upper Extremity Palpation - Radial pulse - Bilateral - 2+.  Neurologic Sensation Upper Extremity - Right - sensation is diminished in the upper extremity. Reflexes Biceps Reflex - Bilateral - 1+. Brachioradialis Reflex - Bilateral - 1+. Triceps Reflex - Bilateral - 1+. Hoffman's Sign - Bilateral - Hoffman's sign not  present.  Musculoskeletal Spine/Ribs/Pelvis  Cervical Spine : Inspection and Palpation - Tenderness - right cervical  paraspinals tender to palpation, right trapezius tender to palpation and right deltoid tender to palpation. Strength and Tone: Strength: Strength: Strength - Biceps - Bilateral - 5/5. Right - 4-/5. Deltoid - Left - normal. Triceps - Bilateral - 5/5. Wrist Extension - Bilateral - 5/5. Hand Grip - Bilateral - 5/5. Heel walk - Bilateral - able to heel walk without difficulty. Toe Walk - Bilateral - able to walk on toes without difficulty. Heel-Toe Walk - Bilateral - able to heel-toe walk without difficulty. ROM - Flexion - Moderately Decreased and painful. Extension - Moderately Decreased and painful. Left Lateral Flexion - Moderately Decreased and painful. Right Lateral Flexion - Moderately Decreased and painful. Left Rotation - Moderately Decreased and painful. Right Rotation - Moderately Decreased and painful. Pain - . Cervical Spine - Special Testing - axial compression test negative, cross chest impingement test negative. Non-Anatomic Signs - No non-anatomic signs present. Upper Extremity Range of Motion - No truesholder pain with IR/ER of the shoulders.  She is a very pleasant young woman, appears younger than her stated age. She is alert and oriented x3. She has neck pain with palpation and range of motion. She has radicular neuropathic pain radiating down into the trapezius and into the lateral aspect of the deltoid down to about the lateral aspect of the elbow. She has got 4/5 deltoid strength on the right. Diminished sensation to light touch in the C5 distribution on the right side only. Intermittent, short-lived dysesthesias on the left side in the C6 dermatome, but 5/5 strength. No other significant focal motor or sensory deficits noted on exam. Negative Babinski test. Negative clonus. Normal gait pattern. Symmetrical 1+ deep tendon reflexes. Previous well-healed posterior cervical  laminotomy incision from previous left C5-6 foraminotomy and discectomy.  No incontinence of bowel and bladder. No shortness of breath or chest pain.   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 There is a small disc bulge, left sided, C5-6. At this point in time, clinically the patient has symptomatic C5 radiculopathy from the large disc herniation at C4-5.   We have had a discussion about treatment options and she would eventually like it taken care of. She would like to hopefully wait until the first week of March because of school and job. I did tell her that we are now at four months from the onset of her radiculopathy and waiting another two would potentially prolong the overall recovery and would potentially affect the overall success rate, but at this point I think it is reasonable, especially since there is no myelopathic findings. We have gone over the total disc replacement which is what she really would like to do. She is another patient of my daughter who also had a cervical disc replacement. We will move forward with a disc replacement in early March. Between now and then we will at least try a C5 selective nerve root block to diminish her pain. I have also given her a script for tramadol. The risks of surgery include infection, bleeding, nerve damage, death, stroke, paralysis, failure to heal, need for further surgery, ongoing or worse pain, loss of bowel and bladder control, throat pain, swallowing difficulties, hoarseness in the voice. If for technical reasons, I cannot fit the disc in appropriately, then I would bail out to a fusion. All this was explained to her and I have given her some literature to review.

## 2015-10-31 NOTE — Anesthesia Procedure Notes (Signed)
Procedure Name: Intubation Date/Time: 10/31/2015 8:49 AM Performed by: Maryella Shivers Pre-anesthesia Checklist: Patient identified, Emergency Drugs available, Suction available and Patient being monitored Patient Re-evaluated:Patient Re-evaluated prior to inductionOxygen Delivery Method: Circle System Utilized Preoxygenation: Pre-oxygenation with 100% oxygen Intubation Type: IV induction Ventilation: Mask ventilation without difficulty Laryngoscope Size: Mac and 3 Grade View: Grade I Tube type: Oral Tube size: 7.5 mm Number of attempts: 1 Airway Equipment and Method: Stylet and Oral airway Placement Confirmation: ETT inserted through vocal cords under direct vision,  positive ETCO2 and breath sounds checked- equal and bilateral Secured at: 21 cm Tube secured with: Tape Dental Injury: Teeth and Oropharynx as per pre-operative assessment

## 2015-10-31 NOTE — Evaluation (Signed)
Physical Therapy Evaluation Patient Details Name: Monica Kemp MRN: [id number] DOB: 10-08-1970 Today's Date: 10/31/2015   History of Present Illness  pt admitted with elective TDR C4-5  3/1  Clinical Impression  Pt POD #0 disc arthroplasty. Educated on back/neck precautions which pt demonstrated follow through t/o session. Tolerated ambulating 89f w/o AD with close supervision. Encouraged pt to ambulate with nurse staff during the day. Anticipating pt with recovery quickly and will have no further PT needs upon D/C. Will attempt stairs tomorrow and probably D/C.    Follow Up Recommendations No PT follow up;Supervision for mobility/OOB    Equipment Recommendations  None recommended by PT    Recommendations for Other Services       Precautions / Restrictions Precautions Precautions: Cervical Precaution Comments: pt and spouse with verbal understanding Required Braces or Orthoses: Cervical Brace Cervical Brace: Soft collar;At all times Restrictions Weight Bearing Restrictions: No      Mobility  Bed Mobility Overal bed mobility: Needs Assistance Bed Mobility: Rolling;Sidelying to Sit;Sit to Sidelying Rolling: Supervision Sidelying to sit: Supervision     Sit to sidelying: Supervision General bed mobility comments: reviewed log rolling and pt performed easily with minimal pain experience  Transfers Overall transfer level: Modified independent Equipment used: None             General transfer comment: stood from bed and toilet without assist, stable in standing  Ambulation/Gait Ambulation/Gait assistance: Min guard Ambulation Distance (Feet): 300 Feet Assistive device: None Gait Pattern/deviations: WFL(Within Functional Limits) Gait velocity: decreased Gait velocity interpretation: Below normal speed for age/gender General Gait Details: stiff mechanics however safe and no LOB, close guard due to environment  Stairs            Wheelchair Mobility     Modified Rankin (Stroke Patients Only)       Balance Overall balance assessment: Modified Independent                                           Pertinent Vitals/Pain Pain Assessment: Faces Faces Pain Scale: Hurts even more Pain Location: neck Pain Descriptors / Indicators: Aching;Guarding;Operative site guarding Pain Intervention(s): Limited activity within patient's tolerance;Monitored during session;Premedicated before session    Home Living Family/patient expects to be discharged to:: Private residence Living Arrangements: Spouse/significant other;Children Available Help at Discharge: Family;Available 24 hours/day Type of Home: House Home Access: Stairs to enter Entrance Stairs-Rails: Can reach both Entrance Stairs-Number of Steps: 4 Home Layout: One level Home Equipment: None      Prior Function Level of Independence: Independent         Comments: married with four children 15y.o-24y.o., drives, goes to school and works     HJournalist, newspaper       Extremity/Trunk Assessment   Upper Extremity Assessment: Generalized weakness           Lower Extremity Assessment: Overall WFL for tasks assessed      Cervical / Trunk Assessment: Normal  Communication   Communication: No difficulties  Cognition Arousal/Alertness: Awake/alert Behavior During Therapy: WFL for tasks assessed/performed Overall Cognitive Status: Within Functional Limits for tasks assessed                      General Comments General comments (skin integrity, edema, etc.): educated on precautions    Exercises        Assessment/Plan  PT Assessment Patient needs continued PT services  PT Diagnosis Acute pain   PT Problem List Decreased range of motion;Decreased mobility;Pain  PT Treatment Interventions Gait training;Stair training;Functional mobility training;Balance training   PT Goals (Current goals can be found in the Care Plan section) Acute Rehab  PT Goals Patient Stated Goal: decrease pain PT Goal Formulation: With patient Time For Goal Achievement: 11/09/15 Potential to Achieve Goals: Good    Frequency Min 5X/week   Barriers to discharge        Co-evaluation               End of Session Equipment Utilized During Treatment: Gait belt;Cervical collar Activity Tolerance: Patient tolerated treatment well Patient left: in bed;with call bell/phone within reach;with family/visitor present Nurse Communication: Mobility status    Functional Assessment Tool Used: clinical judgement Functional Limitation: Mobility: Walking and moving around Mobility: Walking and Moving Around Current Status (J9417): At least 1 percent but less than 20 percent impaired, limited or restricted Mobility: Walking and Moving Around Goal Status [phone]): At least 1 percent but less than 20 percent impaired, limited or restricted    Time: 1520-1545 PT Time Calculation (min) (ACUTE ONLY): 25 min   Charges:   PT Evaluation $PT Eval Low Complexity: 1 Procedure PT Treatments $Gait Training: 8-22 mins   PT G Codes:   PT G-Codes **NOT FOR INPATIENT CLASS** Functional Assessment Tool Used: clinical judgement Functional Limitation: Mobility: Walking and moving around Mobility: Walking and Moving Around Current Status (G8185): At least 1 percent but less than 20 percent impaired, limited or restricted Mobility: Walking and Moving Around Goal Status [phone]): At least 1 percent but less than 20 percent impaired, limited or restricted    Ara Kussmaul 10/31/2015, 4:12 PM  Ara Kussmaul, Student Physical Therapist Acute Rehab [phone]

## 2015-10-31 NOTE — Op Note (Signed)
NAMELANINA, LARRANAGA NO.:  [id number]  MEDICAL RECORD NO.:  [id number]  LOCATION:  [facility]                         FACILITY:  [facility]  PHYSICIAN:  Azar South D. Rolena Infante, M.D. 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  OPERATIVE PROCEDURE:  Cervical total disk replacement C4-5.  COMPLICATIONS:  None.  EQUIPMENT UTILIZED:  The DePuy Prodisc-C 6 mm medium deep implant.  FIRST ASSISTANT:  Ronette Deter, my PA  COMPLICATIONS:  None.  CONDITION:  Stable.  HISTORY:  This is a very pleasant 45 year old woman who presented with significant radicular right arm pain with weakness of the deltoid numbness and dysesthesias.  Attempts at conservative management have failed to alleviate her symptoms and so we elected to proceed with surgery.  All appropriate risks, benefits, and alternatives were discussed with the patient and consent was obtained.  OPERATIVE NOTE:  The patient was brought to the operating room, placed supine on the operating table.  After successful induction of general anesthesia and endotracheal intubation, TEDs, SCDs were applied.  The arms were secured at the side and anterior cervical spine was prepped and draped in standard fashion.  Time-out was taken to confirm patient, procedure, and all other pertinent important data.  Once this was done, x-ray was used to identify the C4-5 disk space and the incision site was drawn on the neck.  The incision was then infiltrated with 0.25% Marcaine and starting at the midline, a small incision was made proceeding to the left.  Sharp dissection was carried out down to the platysma.  The platysma was sharply incised and I began dissecting into the deep cervical fascia.  The omohyoid  muscle was identified and protected.  It was released from its sling to allow greater retraction. I did not feel as though sacrificing was required.  I continued a blunt dissection through the prevertebral fascia down to the anterior longitudinal ligament.  I swept the trachea and esophagus to the right and protected it with a Thyroid retractor and then identified the carotid sheath and protected with a finger.  I then placed a needle into the L4-5 disk space and took an x-ray and confirmed that I was at the appropriate level.  Once this was done, I mobilized the longus colli muscle at the level of the 4-5 disk space using bipolar electrocautery. I then placed my retracting blade underneath the longus colli muscle, deflated the endotracheal cuff, expanded the Caspar retractors to the appropriate width and then reinflated the cuff.  A 15 blade scalpel was used to perform an annulotomy and then used a pituitary rongeurs, curettes, and Kerrison rongeurs to remove the bulk of the 4-5 disk space.  I then identified the midline of the 4 and 5 disk space using an AP x-ray and used my awl to place my distraction pins.  Once both pins were placed and I confirmed that they were in the middle of the 4-5 vertebral bodies, I  then went back to the lateral.  I distracted the intervertebral space with a lamina spreader and then maintained the distraction using my distraction pins.  I then continued to remove the remaining posterior aspect of the disk.  I then used a 1 mm Kerrison rongeur to trim down the posterior osteophytes from the vertebral bodies of 4 and C5.  This allowed me to see into the right lateral gutter.  Using a fine nerve hook, I was able to deliver 3 large fragments of disk material consistent with what was seen on the preoperative MRI.  Once these were excised, I was able to use my fine nerve hook to develop a plane underneath the posterior longitudinal ligament.  I then used my 1 mm  Kerrison to resect the PLL.  Once I was looking at the thecal sac, I was able to confirm that an adequate diskectomy and removed all of the fragments.  At this point, I irrigated the wound copiously with normal saline and again directly evaluated the endplates to ensure there was no cartilaginous endplate remaining.  At this point, I then trialed the 5 and a 6 mm spacer and I elected to use a 6 as it provided better overall fit.  I then used the bur to drill my cuts for the trough and then cleaned the trough.  I then irrigated the wound copiously with normal saline again and then implanted the size 6 medium deep Prodisc-C implant, had good fit, satisfactory position.  I could flex and extend the neck with fluoro and confirmed motion of the disk though the replacement.  At this point, I was very pleased with my neural decompression/diskectomy as well as implant position.  I then irrigated this wound copiously with normal saline and then made sure that I had hemostasis using bone wax and the distraction pinholes sites, bipolar electrocautery, and FloSeal.  Once I had hemostasis, I then returned the trachea and esophagus to midline, closed the platysma with interrupted 2-0 Vicryl sutures and a 3-0 Monocryl for the skin.  Steri- Strips and a dry dressing were applied.  Soft collar was then applied and the patient was ultimately extubated, transferred to PACU without incident.  At the end of the case, all needles and sponge counts were correct.  There were no adverse intraoperative events.     Jissel Slavens D. Rolena Infante, M.D.     DDB/MEDQ  D:  10/31/2015  T:  10/31/2015  Job:  [id number]  cc:   Duane Lope D. Rolena Infante, M.D.'s Office

## 2015-10-31 NOTE — Anesthesia Preprocedure Evaluation (Addendum)
Anesthesia Evaluation  Patient identified by MRN, date of birth, ID band Patient awake    Reviewed: Allergy & Precautions, NPO status , Patient's Chart, lab work & pertinent test results  History of Anesthesia Complications (+) PONV  Airway Mallampati: I  TM Distance: >3 FB Neck ROM: Full    Dental   Pulmonary neg pulmonary ROS,    Pulmonary exam normal        Cardiovascular negative cardio ROS Normal cardiovascular exam     Neuro/Psych Seizures -,  negative psych ROS   GI/Hepatic   Endo/Other    Renal/GU      Musculoskeletal   Abdominal   Peds  Hematology   Anesthesia Other Findings   Reproductive/Obstetrics                            Anesthesia Physical Anesthesia Plan  ASA: II  Anesthesia Plan: General   Post-op Pain Management:    Induction: Intravenous  Airway Management Planned: Oral ETT  Additional Equipment:   Intra-op Plan:   Post-operative Plan: Extubation in OR  Informed Consent: I have reviewed the patients History and Physical, chart, labs and discussed the procedure including the risks, benefits and alternatives for the proposed anesthesia with the patient or authorized representative who has indicated his/her understanding and acceptance.     Plan Discussed with: CRNA and Surgeon  Anesthesia Plan Comments:         Anesthesia Quick Evaluation

## 2015-10-31 NOTE — Transfer of Care (Signed)
Immediate Anesthesia Transfer of Care Note  Patient: Monica Kemp  Procedure(s) Performed: Procedure(s): CERVICAL ANTERIOR DISC ARTHROPLASTY RIGHT C4 - C5 (Right)  Patient Location: PACU  Anesthesia Type:General  Level of Consciousness: awake, alert  and oriented  Airway & Oxygen Therapy: Patient Spontanous Breathing and Patient connected to nasal cannula oxygen  Post-op Assessment: Report given to RN and Post -op Vital signs reviewed and stable  Post vital signs: Reviewed and stable  Last Vitals:  Filed Vitals:   10/31/15 0720  BP: 122/95  Pulse: 90  Temp: 36.8 C  Resp: 20    Complications: No apparent anesthesia complications

## 2015-10-31 NOTE — Brief Op Note (Signed)
10/31/2015  11:11 AM  PATIENT:  Monica Kemp  45 y.o. female  PRE-OPERATIVE DIAGNOSIS:  C4 - C5 Right HNP  POST-OPERATIVE DIAGNOSIS:  C4 - C5 Right HNP  PROCEDURE:  Procedure(s): CERVICAL ANTERIOR DISC ARTHROPLASTY RIGHT C4 - C5 (Right)  SURGEON:  Surgeon(s) and Role:    * Melina Schools, MD - Primary  PHYSICIAN ASSISTANT:   ASSISTANTS: Carmen Mayo   ANESTHESIA:   general  EBL:  Total I/O In: 1600 [I.V.:1600] Out: -   BLOOD ADMINISTERED:none  DRAINS: none   LOCAL MEDICATIONS USED:  MARCAINE     SPECIMEN:  No Specimen  DISPOSITION OF SPECIMEN:  N/A  COUNTS:  YES  TOURNIQUET:  * No tourniquets in log *  DICTATION: .Other Dictation: Dictation Number [id number]  PLAN OF CARE: Admit for overnight observation  PATIENT DISPOSITION:  PACU - hemodynamically stable.

## 2015-11-01 ENCOUNTER — Encounter (HOSPITAL_COMMUNITY): Payer: Self-pay | Admitting: Orthopedic Surgery

## 2015-11-01 DIAGNOSIS — M50121 Cervical disc disorder at C4-C5 level with radiculopathy: Secondary | ICD-10-CM | POA: Diagnosis not present

## 2015-11-01 MED ORDER — OXYCODONE-ACETAMINOPHEN 10-325 MG PO TABS: 1.0000 | ORAL_TABLET | ORAL | Status: DC | PRN

## 2015-11-01 NOTE — Progress Notes (Signed)
Physical Therapy Treatment Patient Details Name: TORRIN FREIN MRN: [id number] DOB: 1971-03-07 Today's Date: 11/01/2015    History of Present Illness pt admitted with elective TDR C4-5  3/1    PT Comments    Pt progressing towards physical therapy goals. Continues to require light cueing for proper mechanics and maintenance of precautions. Husband present during session and very supportive. Pt anticipates d/c home this afternoon. Will continue to follow.   Follow Up Recommendations  No PT follow up;Supervision for mobility/OOB     Equipment Recommendations  None recommended by PT    Recommendations for Other Services       Precautions / Restrictions Precautions Precautions: Cervical Precaution Comments: Reviewed precautions with pt and husband Required Braces or Orthoses: Cervical Brace Cervical Brace: Soft collar;At all times Restrictions Weight Bearing Restrictions: No    Mobility  Bed Mobility Overal bed mobility: Needs Assistance Bed Mobility: Rolling;Sidelying to Sit;Sit to Sidelying Rolling: Modified independent (Device/Increase time) Sidelying to sit: Modified independent (Device/Increase time)     Sit to sidelying: Modified independent (Device/Increase time) General bed mobility comments: Mod I for mobility to/from EOB however pt requires cues for proper log roll technique for safety.   Transfers Overall transfer level: Modified independent Equipment used: None             General transfer comment: No assist required, and no unsteadiness noted.   Stair training: 5 stairs; No rails. Husband assisted with HHA on the L side. No unsteadiness or LOB noted. VC's for proper sequencing and technique.   Ambulation/Gait Ambulation/Gait assistance: Modified independent (Device/Increase time) Ambulation Distance (Feet): 400 Feet Assistive device: None Gait Pattern/deviations: Trunk flexed;WFL(Within Functional Limits) Gait velocity: Decreased   General Gait  Details: Somewhat guarded but generally steady and safe. No assist required.    Stairs            Wheelchair Mobility    Modified Rankin (Stroke Patients Only)       Balance Overall balance assessment: History of Falls                                  Cognition Arousal/Alertness: Awake/alert Behavior During Therapy: WFL for tasks assessed/performed Overall Cognitive Status: Within Functional Limits for tasks assessed                      Exercises      General Comments        Pertinent Vitals/Pain Pain Assessment: Faces Faces Pain Scale: Hurts a little bit Pain Location: Neck Pain Descriptors / Indicators: Operative site guarding;Aching Pain Intervention(s): Limited activity within patient's tolerance;Monitored during session;Repositioned    Home Living                      Prior Function            PT Goals (current goals can now be found in the care plan section) Acute Rehab PT Goals Patient Stated Goal: decrease pain PT Goal Formulation: With patient Time For Goal Achievement: 11/09/15 Potential to Achieve Goals: Good Progress towards PT goals: Progressing toward goals    Frequency  Min 5X/week    PT Plan Current plan remains appropriate    Co-evaluation             End of Session Equipment Utilized During Treatment: Cervical collar Activity Tolerance: Patient tolerated treatment well Patient left: in bed;with call bell/phone within  reach;with family/visitor present     Time: 4081-4481 PT Time Calculation (min) (ACUTE ONLY): 16 min  Charges:  $Gait Training: 8-22 mins                    G Codes:      Rolinda Roan 11-20-2015, 1:27 PM  Rolinda Roan, PT, DPT Acute Rehabilitation Services Pager: [phone]

## 2015-11-01 NOTE — Evaluation (Signed)
Occupational Therapy Evaluation and Discharge Patient Details Name: Monica Kemp MRN: [id number] DOB: 1971-06-15 Today's Date: 11/01/2015    History of Present Illness pt admitted with elective TDR C4-5  3/1   Clinical Impression   Pt reports she was independent with ADLs and mobility PTA. Currently pt overall supervision for safety with ADLs and functional mobility. All education complete; pt and husband with no further questions or concerns for OT at this time. Pt planning to d/c home with 24/7 supervision initially. No further acute OT needs identified; pt ready to d/c from OT standpoint. Signing off at this time. Thank you for this referral.    Follow Up Recommendations  No OT follow up;Supervision - Intermittent    Equipment Recommendations  None recommended by OT    Recommendations for Other Services       Precautions / Restrictions Precautions Precautions: Cervical Precaution Comments: Reviewed precautions with pt and husband Required Braces or Orthoses: Cervical Brace Cervical Brace: Soft collar;At all times Restrictions Weight Bearing Restrictions: No      Mobility Bed Mobility Overal bed mobility: Needs Assistance Bed Mobility: Rolling;Sidelying to Sit;Sit to Sidelying Rolling: Supervision Sidelying to sit: Supervision     Sit to sidelying: Supervision General bed mobility comments: Pt able to demo log roll technique without VCs.  Transfers Overall transfer level: Modified independent Equipment used: None             General transfer comment: Sit to stand from EOB x 1, toilet x 1. No LOB or unsteadyness noted.    Balance Overall balance assessment: No apparent balance deficits (not formally assessed);Modified Independent                                          ADL Overall ADL's : Needs assistance/impaired Eating/Feeding: Modified independent;Sitting   Grooming: Supervision/safety;Wash/dry hands;Standing Grooming Details  (indicate cue type and reason): Educated on use of 2 cups for oral care Upper Body Bathing: Supervision/ safety;Standing   Lower Body Bathing: Supervison/ safety   Upper Body Dressing : Set up;Sitting   Lower Body Dressing: Supervision/safety;Sit to/from stand Lower Body Dressing Details (indicate cue type and reason): Pt able to cross foot over opposite knee. Educated on compensatory strategies for LB ADLs; pt verbalized understanding. Toilet Transfer: Supervision/safety;Ambulation;Regular Toilet   Toileting- Water quality scientist and Hygiene: Supervision/safety;Sit to/from stand       Functional mobility during ADLs: Supervision/safety General ADL Comments: Pts husband present for OT eval. Educated on home safety, placing frequently used items at countertop height, maintaining precautions throughout functional activities, supervision for safety with shower transfers and bathing initially.      Vision Vision Assessment?: No apparent visual deficits   Perception     Praxis      Pertinent Vitals/Pain Pain Assessment: 0-10 Pain Score: 5  Pain Location: neck, L shoulder Pain Descriptors / Indicators: Aching;Sore Pain Intervention(s): Limited activity within patient's tolerance;Monitored during session     Hand Dominance Right   Extremity/Trunk Assessment Upper Extremity Assessment Upper Extremity Assessment: Overall WFL for tasks assessed   Lower Extremity Assessment Lower Extremity Assessment: Defer to PT evaluation   Cervical / Trunk Assessment Cervical / Trunk Assessment: Normal   Communication Communication Communication: No difficulties   Cognition Arousal/Alertness: Awake/alert Behavior During Therapy: WFL for tasks assessed/performed Overall Cognitive Status: Within Functional Limits for tasks assessed  General Comments       Exercises       Shoulder Instructions      Home Living Family/patient expects to be discharged to::  Private residence Living Arrangements: Spouse/significant other;Children Available Help at Discharge: Family;Available 24 hours/day (initially) Type of Home: House Home Access: Stairs to enter [employer] of Steps: 4 Entrance Stairs-Rails: Can reach both Home Layout: One level     Bathroom Shower/Tub: Occupational psychologist: Standard Bathroom Accessibility: Yes How Accessible: Accessible via walker Home Equipment: Hand held shower head          Prior Functioning/Environment Level of Independence: Independent        Comments: married with four children 15y.o-24y.o., drives, goes to school and works    OT Diagnosis: Acute pain   OT Problem List:     OT Treatment/Interventions:      OT Goals(Current goals can be found in the care plan section) Acute Rehab OT Goals OT Goal Formulation: All assessment and education complete, DC therapy  OT Frequency:     Barriers to D/C:            Co-evaluation              End of Session Equipment Utilized During Treatment: Cervical collar  Activity Tolerance: Patient tolerated treatment well Patient left: in bed;with call bell/phone within reach;with family/visitor present   Time: 0732-0745 OT Time Calculation (min): 13 min Charges:  OT General Charges $OT Visit: 1 Procedure OT Evaluation $OT Eval Low Complexity: 1 Procedure G-Codes: OT G-codes **NOT FOR INPATIENT CLASS** Functional Assessment Tool Used: Clinical judgement Functional Limitation: Self care Self Care Current Status (W5809): At least 1 percent but less than 20 percent impaired, limited or restricted Self Care Goal Status (X8338): At least 1 percent but less than 20 percent impaired, limited or restricted Self Care Discharge Status [phone]): At least 1 percent but less than 20 percent impaired, limited or restricted   Binnie Kand M.S., OTR/L Pager: [phone]  11/01/2015, 8:04 AM

## 2015-11-01 NOTE — Progress Notes (Signed)
Patient ID: Monica Kemp, female   DOB: 1971-01-24, 45 y.o.   MRN: [id number]    Subjective: 1 Day Post-Op Procedure(s) (LRB): CERVICAL ANTERIOR DISC ARTHROPLASTY RIGHT C4 - C5 (Right) Patient reports pain as 5 on 0-10 scale.   Denies CP or SOB.  Voiding without difficulty. Negative flatus. Objective: Vital signs in last 24 hours: Temp:  [97.8 F (36.6 C)-98.4 F (36.9 C)] 98 F (36.7 C) (03/02 0400) Pulse Rate:  [71-106] 71 (03/02 0400) Resp:  [14-20] 20 (03/02 0400) BP: (102-135)/(66-83) 112/68 mmHg (03/02 0400) SpO2:  [94 %-100 %] 94 % (03/02 0400)  Intake/Output from previous day: 03/01 0701 - 03/02 0700 In: 1600 [I.V.:1600] Out: 50 [Blood:50] Intake/Output this shift:    Labs: No results for input(s): HGB in the last 72 hours. No results for input(s): WBC, RBC, HCT, PLT in the last 72 hours. No results for input(s): NA, K, CL, CO2, BUN, CREATININE, GLUCOSE, CALCIUM in the last 72 hours. No results for input(s): LABPT, INR in the last 72 hours.  Physical Exam: Neurologically intact ABD soft Sensation intact distally Dorsiflexion/Plantar flexion intact Incision: dressing C/D/I Compartment soft Pt wearing soft collar  Assessment/Plan: 1 Day Post-Op Procedure(s) (LRB): CERVICAL ANTERIOR DISC ARTHROPLASTY RIGHT C4 - C5 (Right) Advance diet Up with therapy  Consider DC today after cleared by PT  [place], Darla Lesches for Dr. Melina Schools [facility] [phone] 11/01/2015, 7:44 AM    Doing well No radicular arm pain Neuro intact F/u in 2 weeks

## 2015-11-01 NOTE — Progress Notes (Signed)
Pt. discharged home accompanied by husband. Prescriptions and discharge instructions given with verbalization of understanding. Incision site on neck with no s/s of infection - no swelling, redness, bleeding, and/or drainage noted. Soft collar intact. Opportunity given to ask questions but no question asked. Pt. transported out of this unit in wheelchair by the volunteer

## 2015-11-01 NOTE — Progress Notes (Signed)
Orthopedic Tech Progress Note Patient Details:  Monica Kemp 26-Jun-1971 [id number]  Ortho Devices Type of Ortho Device: Soft collar Ortho Device/Splint Interventions: Application   [name] 11/01/2015, 7:16 AM

## 2015-11-15 NOTE — Discharge Summary (Signed)
Physician Discharge Summary  Patient ID: [name] MRN: [id number] DOB/AGE: 43-Aug-1972 43 y.o.  Admit date: 10/31/2015 Discharge date: 11/15/2015  Admission Diagnoses:  Cervical Degenerative Disc Diesease  Discharge Diagnoses:  Active Problems:   Neck pain   Past Medical History  Diagnosis Date  . UTI (lower urinary tract infection)   . Seizures ([place])     one time 15 years ago    Surgeries: Procedure(s): CERVICAL ANTERIOR DISC ARTHROPLASTY RIGHT C4 - C5 on 10/31/2015   Consultants (if any):    Discharged Condition: Improved  Hospital Course: Monica Kemp is an 45 y.o. female who was admitted 10/31/2015 with a diagnosis of Cervical Degenerative Disc disease and went to the operating room on 10/31/2015 and underwent the above named procedures.  Pt discharged on 11/01/15.  She was given perioperative antibiotics:  Anti-infectives    Start     Dose/Rate Route Frequency Ordered Stop   10/31/15 2000  vancomycin (VANCOCIN) IVPB 1000 mg/200 mL premix     1,000 mg 200 mL/hr over 60 Minutes Intravenous  Once 10/31/15 1413 10/31/15 2125   10/31/15 0735  vancomycin (VANCOCIN) 1 GM/200ML IVPB    Comments:  Sammuel Cooper   : cabinet override      10/31/15 0735 10/31/15 1944   10/31/15 0732  vancomycin (VANCOCIN) IVPB 1000 mg/200 mL premix     1,000 mg 200 mL/hr over 60 Minutes Intravenous 60 min pre-op 10/31/15 0732 10/31/15 0935    .  She was given sequential compression devices, early ambulation, and TED for DVT prophylaxis.  She benefited maximally from the hospital stay and there were no complications.    Recent vital signs:  Filed Vitals:   11/01/15 0748 11/01/15 1155  BP: 121/68 117/71  Pulse: 69 76  Temp: 97.7 F (36.5 C) 98.1 F (36.7 C)  Resp: 18 18    Recent laboratory studies:  Lab Results  Component Value Date   HGB 14.4 10/23/2015   Lab Results  Component Value Date   WBC 5.9 10/23/2015   PLT 221 10/23/2015   No results found for: INR Lab Results   Component Value Date   NA 138 03/18/2013   K 3.7 03/18/2013   CL 106 03/18/2013   CO2 22 03/18/2013   BUN 12 03/18/2013   CREATININE 0.83 03/18/2013   GLUCOSE 166* 03/18/2013    Discharge Medications:     Medication List    STOP taking these medications        cyclobenzaprine 10 MG tablet  Commonly known as:  FLEXERIL     traMADol 50 MG tablet  Commonly known as:  ULTRAM      TAKE these medications        aspirin EC 81 MG tablet  Take 1 tablet (81 mg total) by mouth daily. Start on POD #2     fluticasone 50 MCG/ACT nasal spray  Commonly known as:  FLONASE  Place 1 spray into both nostrils daily.     methocarbamol 500 MG tablet  Commonly known as:  ROBAXIN  Take 1 tablet (500 mg total) by mouth 3 (three) times daily as needed for muscle spasms.     ondansetron 4 MG tablet  Commonly known as:  ZOFRAN  Take 1 tablet (4 mg total) by mouth every 8 (eight) hours as needed for nausea or vomiting.     oxyCODONE-acetaminophen 10-325 MG tablet  Commonly known as:  PERCOCET  Take 1 tablet by mouth every 4 (four) hours as needed  for pain.     oxyCODONE-acetaminophen 10-325 MG tablet  Commonly known as:  PERCOCET  Take 1 tablet by mouth every 4 (four) hours as needed for pain.     PRESCRIPTION MEDICATION  Apply 1 application topically once a week. Biomedical Cream w/ Testosterone and Estrogen     progesterone 100 MG capsule  Commonly known as:  PROMETRIUM  Take 100 mg by mouth at bedtime.     RELPAX 20 MG tablet  Generic drug:  eletriptan  Take 10 mg by mouth every 2 (two) hours as needed for migraine or headache. May repeat in 2 hours if headache persists or recurs.     VAGIFEM 10 MCG Tabs vaginal tablet  Generic drug:  Estradiol  Place 1 tablet vaginally 2 (two) times a week.        Diagnostic Studies: Dg Cervical Spine 2 Or 3 Views  10/31/2015  CLINICAL DATA:  Postop ACDF C4-5 EXAM: CERVICAL SPINE - 2-3 VIEW COMPARISON:  MRI of 09/05/2015 FINDINGS: Interbody  disc arthroplasty changes noted at C4-5. No hardware or bony complicating feature. Normal alignment. IMPRESSION: Postoperative changes at C4-5.  No visible complicating feature. Electronically Signed   By: Rolm Baptise M.D.   On: 10/31/2015 12:08   Dg Cervical Spine 2-3 Views  10/31/2015  CLINICAL DATA:  C4-5 disc arthroplasty. EXAM: CERVICAL SPINE - 2-3 VIEW; DG C-ARM 61-120 MIN COMPARISON:  None. FINDINGS: Interbody disc arthroplasty is seen at C4-5. Alignment is normal at this level. IMPRESSION: Interbody disc arthroplasty at C4-5 with normal alignment. Electronically Signed   By: Earle Gell M.D.   On: 10/31/2015 11:16   Dg C-arm 61-120 Min  10/31/2015  CLINICAL DATA:  C4-5 disc arthroplasty. EXAM: CERVICAL SPINE - 2-3 VIEW; DG C-ARM 61-120 MIN COMPARISON:  None. FINDINGS: Interbody disc arthroplasty is seen at C4-5. Alignment is normal at this level. IMPRESSION: Interbody disc arthroplasty at C4-5 with normal alignment. Electronically Signed   By: Earle Gell M.D.   On: 10/31/2015 11:16    Disposition: 01-Home or Self Care        Follow-up Information    Follow up with Dahlia Bailiff, MD. Schedule an appointment as soon as possible for a visit in 2 weeks.   Specialty:  Orthopedic Surgery   Why:  For wound re-check, fracture follow up, For suture removal   Contact information:   [address] [place] [address] [phone]        Signed: Valinda Hoar 11/15/2015, 1:30 PM

## 2018-09-16 ENCOUNTER — Ambulatory Visit: Payer: Self-pay | Admitting: Orthopedic Surgery

## 2018-10-26 NOTE — Patient Instructions (Addendum)
TEMISHA MURLEY  10/26/2018   Your procedure is scheduled on: Thursday 11/04/2018  Report to [facility] Main  Entrance              Report to Short Stay at  [place]  AM    Call this number if you have problems the morning of surgery [phone]    Remember: Do not eat food or drink liquids :After Midnight.              BRUSH YOUR TEETH MORNING OF SURGERY AND RINSE YOUR MOUTH OUT, NO CHEWING GUM CANDY OR MINTS.     Take these medicines the morning of surgery with A SIP OF WATER: use Flonase nasal spray                                  You may not have any metal on your body including hair pins and              piercings  Do not wear jewelry, make-up, lotions, powders or perfumes, deodorant             Do not wear nail polish.  Do not shave  48 hours prior to surgery.                Do not bring valuables to the hospital. [place].  Contacts, dentures or bridgework may not be worn into surgery.  Leave suitcase in the car. After surgery it may be brought to your room.     Patients discharged the day of surgery will not be allowed to drive home. IF YOU ARE HAVING SURGERY AND GOING HOME THE SAME DAY, YOU MUST HAVE AN ADULT TO DRIVE YOU HOME AND BE WITH YOU FOR 24 HOURS. YOU MAY GO HOME BY TAXI OR UBER OR ORTHERWISE, BUT AN ADULT MUST ACCOMPANY YOU HOME AND STAY WITH YOU FOR 24 HOURS.  Name and phone number of your driver:spouse- Telford Nab                Please read over the following fact sheets you were given: _____________________________________________________________________             [facility] - Preparing for Surgery Before surgery, you can play an important role.  Because skin is not sterile, your skin needs to be as free of germs as possible.  You can reduce the number of germs on your skin by washing with CHG (chlorahexidine gluconate) soap before surgery.  CHG is an antiseptic cleaner which kills  germs and bonds with the skin to continue killing germs even after washing. Please DO NOT use if you have an allergy to CHG or antibacterial soaps.  If your skin becomes reddened/irritated stop using the CHG and inform your nurse when you arrive at Short Stay. Do not shave (including legs and underarms) for at least 48 hours prior to the first CHG shower.  You may shave your face/neck. Please follow these instructions carefully:  1.  Shower with CHG Soap the night before surgery and the  morning of Surgery.  2.  If you choose to wash your hair, wash your hair first as usual with your  normal  shampoo.  3.  After you shampoo,  rinse your hair and body thoroughly to remove the  shampoo.                           4.  Use CHG as you would any other liquid soap.  You can apply chg directly  to the skin and wash                       Gently with a scrungie or clean washcloth.  5.  Apply the CHG Soap to your body ONLY FROM THE NECK DOWN.   Do not use on face/ open                           Wound or open sores. Avoid contact with eyes, ears mouth and genitals (private parts).                       Wash face,  Genitals (private parts) with your normal soap.             6.  Wash thoroughly, paying special attention to the area where your surgery  will be performed.  7.  Thoroughly rinse your body with warm water from the neck down.  8.  DO NOT shower/wash with your normal soap after using and rinsing off  the CHG Soap.                9.  Pat yourself dry with a clean towel.            10.  Wear clean pajamas.            11.  Place clean sheets on your bed the night of your first shower and do not  sleep with pets. Day of Surgery : Do not apply any lotions/deodorants the morning of surgery.  Please wear clean clothes to the hospital/surgery center.  FAILURE TO FOLLOW THESE INSTRUCTIONS MAY RESULT IN THE CANCELLATION OF YOUR SURGERY PATIENT SIGNATURE_________________________________  NURSE  SIGNATURE__________________________________  ________________________________________________________________________   Adam Phenix  An incentive spirometer is a tool that can help keep your lungs clear and active. This tool measures how well you are filling your lungs with each breath. Taking long deep breaths may help reverse or decrease the chance of developing breathing (pulmonary) problems (especially infection) following:  A long period of time when you are unable to move or be active. BEFORE THE PROCEDURE   If the spirometer includes an indicator to show your best effort, your nurse or respiratory therapist will set it to a desired goal.  If possible, sit up straight or lean slightly forward. Try not to slouch.  Hold the incentive spirometer in an upright position. INSTRUCTIONS FOR USE  1. Sit on the edge of your bed if possible, or sit up as far as you can in bed or on a chair. 2. Hold the incentive spirometer in an upright position. 3. Breathe out normally. 4. Place the mouthpiece in your mouth and seal your lips tightly around it. 5. Breathe in slowly and as deeply as possible, raising the piston or the ball toward the top of the column. 6. Hold your breath for 3-5 seconds or for as long as possible. Allow the piston or ball to fall to the bottom of the column. 7. Remove the mouthpiece from your mouth and breathe out normally. 8. Rest for a few seconds and  repeat Steps 1 through 7 at least 10 times every 1-2 hours when you are awake. Take your time and take a few normal breaths between deep breaths. 9. The spirometer may include an indicator to show your best effort. Use the indicator as a goal to work toward during each repetition. 10. After each set of 10 deep breaths, practice coughing to be sure your lungs are clear. If you have an incision (the cut made at the time of surgery), support your incision when coughing by placing a pillow or rolled up towels firmly  against it. Once you are able to get out of bed, walk around indoors and cough well. You may stop using the incentive spirometer when instructed by your caregiver.  RISKS AND COMPLICATIONS  Take your time so you do not get dizzy or light-headed.  If you are in pain, you may need to take or ask for pain medication before doing incentive spirometry. It is harder to take a deep breath if you are having pain. AFTER USE  Rest and breathe slowly and easily.  It can be helpful to keep track of a log of your progress. Your caregiver can provide you with a simple table to help with this. If you are using the spirometer at home, follow these instructions: [place] IF:   You are having difficultly using the spirometer.  You have trouble using the spirometer as often as instructed.  Your pain medication is not giving enough relief while using the spirometer.  You develop fever of 100.5 F (38.1 C) or higher. SEEK IMMEDIATE MEDICAL CARE IF:   You cough up bloody sputum that had not been present before.  You develop fever of 102 F (38.9 C) or greater.  You develop worsening pain at or near the incision site. MAKE SURE YOU:   Understand these instructions.  Will watch your condition.  Will get help right away if you are not doing well or get worse. Document Released: 12/29/2006 Document Revised: 11/10/2011 Document Reviewed: 03/01/2007 [facility] Patient Information 2014 [place], [place].   ________________________________________________________________________

## 2018-10-27 ENCOUNTER — Encounter (HOSPITAL_COMMUNITY): Payer: Self-pay | Admitting: *Deleted

## 2018-10-27 ENCOUNTER — Encounter (HOSPITAL_COMMUNITY)
Admission: RE | Admit: 2018-10-27 | Discharge: 2018-10-27 | Disposition: A | Discharge status: Home / Self Care | Payer: 59 | Source: Ambulatory Visit | Attending: Orthopedic Surgery | Admitting: Orthopedic Surgery

## 2018-10-27 ENCOUNTER — Other Ambulatory Visit: Payer: Self-pay

## 2018-10-27 DIAGNOSIS — Z01812 Encounter for preprocedural laboratory examination: Secondary | ICD-10-CM | POA: Diagnosis not present

## 2018-10-27 DIAGNOSIS — S83206A Unspecified tear of unspecified meniscus, current injury, right knee, initial encounter: Secondary | ICD-10-CM | POA: Diagnosis not present

## 2018-10-27 HISTORY — DX: Meniere's disease, unspecified ear: H81.09

## 2018-10-27 HISTORY — DX: Other specified postprocedural states: Z98.890

## 2018-10-27 HISTORY — DX: Unspecified osteoarthritis, unspecified site: M19.90

## 2018-10-27 HISTORY — DX: Nausea with vomiting, unspecified: R11.2

## 2018-10-27 HISTORY — DX: Adverse effect of unspecified anesthetic, initial encounter: T41.45XA

## 2018-10-27 HISTORY — DX: Headache, unspecified: R51.9

## 2018-10-27 HISTORY — DX: Headache: R51

## 2018-10-27 HISTORY — DX: Other complications of anesthesia, initial encounter: T88.59XA

## 2018-10-27 LAB — CBC
HCT: 40.2 % (ref 36.0–46.0)
Hemoglobin: 13.5 g/dL (ref 12.0–15.0)
MCH: 32 pg (ref 26.0–34.0)
MCHC: 33.6 g/dL (ref 30.0–36.0)
MCV: 95.3 fL (ref 80.0–100.0)
Platelets: 233 10*3/uL (ref 150–400)
RBC: 4.22 MIL/uL (ref 3.87–5.11)
RDW: 11.9 % (ref 11.5–15.5)
WBC: 5.5 10*3/uL (ref 4.0–10.5)
nRBC: 0 % (ref 0.0–0.2)

## 2018-10-27 LAB — BASIC METABOLIC PANEL
Anion gap: 9 (ref 5–15)
BUN: 15 mg/dL (ref 6–20)
CO2: 22 mmol/L (ref 22–32)
Calcium: 9 mg/dL (ref 8.9–10.3)
Chloride: 107 mmol/L (ref 98–111)
Creatinine, Ser: 0.98 mg/dL (ref 0.44–1.00)
GFR calc non Af Amer: >60 mL/min (ref >60)
Glucose, Bld: 106 mg/dL — ABNORMAL HIGH (ref 70–99)
Potassium: 4 mmol/L (ref 3.5–5.1)
Sodium: 138 mmol/L (ref 135–145)

## 2018-11-03 NOTE — Anesthesia Preprocedure Evaluation (Signed)
Anesthesia Evaluation  Patient identified by MRN, date of birth, ID band Patient awake    Reviewed: Allergy & Precautions, NPO status , Patient's Chart, lab work & pertinent test results  History of Anesthesia Complications (+) PONV and history of anesthetic complications  Airway Mallampati: I  TM Distance: >3 FB Neck ROM: Full    Dental  (+) Dental Advisory Given   Pulmonary neg pulmonary ROS, former smoker,    Pulmonary exam normal        Cardiovascular negative cardio ROS Normal cardiovascular exam     Neuro/Psych  Headaches, Seizures -,  negative psych ROS   GI/Hepatic   Endo/Other    Renal/GU      Musculoskeletal  (+) Arthritis ,   Abdominal   Peds  Hematology   Anesthesia Other Findings   Reproductive/Obstetrics                             Anesthesia Physical  Anesthesia Plan  ASA: II  Anesthesia Plan: General   Post-op Pain Management:    Induction: Intravenous  PONV Risk Score and Plan: 4 or greater and Ondansetron, Dexamethasone, Midazolam, Scopolamine patch - Pre-op and Treatment may vary due to age or medical condition  Airway Management Planned: LMA  Additional Equipment: None  Intra-op Plan:   Post-operative Plan: Extubation in OR  Informed Consent: I have reviewed the patients History and Physical, chart, labs and discussed the procedure including the risks, benefits and alternatives for the proposed anesthesia with the patient or authorized representative who has indicated his/her understanding and acceptance.       Plan Discussed with: CRNA and Surgeon  Anesthesia Plan Comments:         Anesthesia Quick Evaluation

## 2018-11-04 ENCOUNTER — Encounter (HOSPITAL_COMMUNITY)
Admission: RE | Disposition: A | Discharge status: Home / Self Care | Payer: Self-pay | Source: Ambulatory Visit | Attending: Orthopedic Surgery

## 2018-11-04 ENCOUNTER — Encounter (HOSPITAL_COMMUNITY): Payer: Self-pay | Admitting: *Deleted

## 2018-11-04 ENCOUNTER — Ambulatory Visit (HOSPITAL_COMMUNITY): Payer: 59 | Admitting: Physician Assistant

## 2018-11-04 ENCOUNTER — Ambulatory Visit (HOSPITAL_COMMUNITY)
Admission: RE | Admit: 2018-11-04 | Discharge: 2018-11-04 | Disposition: A | Discharge status: Home / Self Care | Payer: 59 | Source: Ambulatory Visit | Attending: Orthopedic Surgery | Admitting: Orthopedic Surgery

## 2018-11-04 ENCOUNTER — Ambulatory Visit (HOSPITAL_COMMUNITY): Payer: 59 | Admitting: Certified Registered Nurse Anesthetist

## 2018-11-04 DIAGNOSIS — Z87891 Personal history of nicotine dependence: Secondary | ICD-10-CM | POA: Diagnosis not present

## 2018-11-04 DIAGNOSIS — Z881 Allergy status to other antibiotic agents status: Secondary | ICD-10-CM | POA: Insufficient documentation

## 2018-11-04 DIAGNOSIS — Z79899 Other long term (current) drug therapy: Secondary | ICD-10-CM | POA: Diagnosis not present

## 2018-11-04 DIAGNOSIS — Z885 Allergy status to narcotic agent status: Secondary | ICD-10-CM | POA: Diagnosis not present

## 2018-11-04 DIAGNOSIS — Z7951 Long term (current) use of inhaled steroids: Secondary | ICD-10-CM | POA: Insufficient documentation

## 2018-11-04 DIAGNOSIS — Z7982 Long term (current) use of aspirin: Secondary | ICD-10-CM | POA: Diagnosis not present

## 2018-11-04 DIAGNOSIS — Z882 Allergy status to sulfonamides status: Secondary | ICD-10-CM | POA: Diagnosis not present

## 2018-11-04 DIAGNOSIS — S83206A Unspecified tear of unspecified meniscus, current injury, right knee, initial encounter: Secondary | ICD-10-CM | POA: Insufficient documentation

## 2018-11-04 DIAGNOSIS — X58XXXA Exposure to other specified factors, initial encounter: Secondary | ICD-10-CM | POA: Insufficient documentation

## 2018-11-04 HISTORY — PX: KNEE ARTHROSCOPY WITH MEDIAL MENISECTOMY: SHX5651

## 2018-11-04 SURGERY — ARTHROSCOPY, KNEE, WITH MEDIAL MENISCECTOMY
Anesthesia: General | Site: Knee | Laterality: Right

## 2018-11-04 MED ORDER — OXYCODONE HCL 5 MG PO TABS: 5.0000 mg | ORAL_TABLET | ORAL | 0 refills | Status: DC | PRN

## 2018-11-04 MED ORDER — ONDANSETRON HCL 4 MG PO TABS: 4.0000 mg | ORAL_TABLET | Freq: Three times a day (TID) | ORAL | 0 refills | Status: DC | PRN

## 2018-11-04 MED ORDER — SCOPOLAMINE 1 MG/3DAYS TD PT72
MEDICATED_PATCH | TRANSDERMAL | Status: AC
  Filled 2018-11-04: qty 1

## 2018-11-04 MED ORDER — PROPOFOL 10 MG/ML IV BOLUS
INTRAVENOUS | Status: AC
  Filled 2018-11-04: qty 20

## 2018-11-04 MED ORDER — METOCLOPRAMIDE HCL 5 MG PO TABS
5.0000 mg | ORAL_TABLET | Freq: Three times a day (TID) | ORAL | Status: DC | PRN
  Filled 2018-11-04: qty 2

## 2018-11-04 MED ORDER — FENTANYL CITRATE (PF) 100 MCG/2ML IJ SOLN
INTRAMUSCULAR | Status: AC
  Filled 2018-11-04: qty 2

## 2018-11-04 MED ORDER — HYDROMORPHONE HCL 1 MG/ML IJ SOLN
0.2500 mg | INTRAMUSCULAR | Status: DC | PRN
  Administered 2018-11-04 (×2): 0.5 mg via INTRAVENOUS

## 2018-11-04 MED ORDER — HYDROMORPHONE HCL 1 MG/ML IJ SOLN: 0.5000 mg | INTRAMUSCULAR | Status: DC | PRN

## 2018-11-04 MED ORDER — ISOPROPYL ALCOHOL 70 % SOLN
Status: AC
  Filled 2018-11-04: qty 480

## 2018-11-04 MED ORDER — FENTANYL CITRATE (PF) 100 MCG/2ML IJ SOLN: 100.0000 ug | Freq: Once | INTRAMUSCULAR | Status: DC

## 2018-11-04 MED ORDER — HYDROMORPHONE HCL 1 MG/ML IJ SOLN
INTRAMUSCULAR | Status: AC
  Filled 2018-11-04: qty 1

## 2018-11-04 MED ORDER — LIDOCAINE-EPINEPHRINE 1 %-1:100000 IJ SOLN
INTRAMUSCULAR | Status: DC | PRN
  Administered 2018-11-04: 20 mL

## 2018-11-04 MED ORDER — LACTATED RINGERS IR SOLN
Status: DC | PRN
  Administered 2018-11-04: 6000 mL

## 2018-11-04 MED ORDER — FENTANYL CITRATE (PF) 100 MCG/2ML IJ SOLN
INTRAMUSCULAR | Status: DC | PRN
  Administered 2018-11-04 (×3): 50 ug via INTRAVENOUS

## 2018-11-04 MED ORDER — POVIDONE-IODINE 10 % EX SWAB: 2.0000 "application " | Freq: Once | CUTANEOUS | Status: DC

## 2018-11-04 MED ORDER — HYDROMORPHONE HCL 2 MG PO TABS: 1.0000 mg | ORAL_TABLET | ORAL | Status: DC | PRN

## 2018-11-04 MED ORDER — DEXAMETHASONE SODIUM PHOSPHATE 10 MG/ML IJ SOLN
INTRAMUSCULAR | Status: AC
  Filled 2018-11-04: qty 1

## 2018-11-04 MED ORDER — LIDOCAINE 2% (20 MG/ML) 5 ML SYRINGE
INTRAMUSCULAR | Status: AC
  Filled 2018-11-04: qty 5

## 2018-11-04 MED ORDER — CHLORHEXIDINE GLUCONATE 4 % EX LIQD: 60.0000 mL | Freq: Once | CUTANEOUS | Status: DC

## 2018-11-04 MED ORDER — LACTATED RINGERS IV SOLN
INTRAVENOUS | Status: DC | PRN
  Administered 2018-11-04 (×2): via INTRAVENOUS

## 2018-11-04 MED ORDER — ASPIRIN EC 325 MG PO TBEC: 325.0000 mg | DELAYED_RELEASE_TABLET | Freq: Two times a day (BID) | ORAL | 0 refills | Status: DC

## 2018-11-04 MED ORDER — PROMETHAZINE HCL 25 MG/ML IJ SOLN: 6.2500 mg | INTRAMUSCULAR | Status: DC | PRN

## 2018-11-04 MED ORDER — SODIUM CHLORIDE 0.9 % IV SOLN: INTRAVENOUS | Status: DC

## 2018-11-04 MED ORDER — ONDANSETRON HCL 4 MG/2ML IJ SOLN: 4.0000 mg | Freq: Four times a day (QID) | INTRAMUSCULAR | Status: DC | PRN

## 2018-11-04 MED ORDER — MEPERIDINE HCL 50 MG/ML IJ SOLN: 6.2500 mg | INTRAMUSCULAR | Status: DC | PRN

## 2018-11-04 MED ORDER — BUPIVACAINE-EPINEPHRINE (PF) 0.25% -1:200000 IJ SOLN
INTRAMUSCULAR | Status: AC
  Filled 2018-11-04: qty 30

## 2018-11-04 MED ORDER — OXYCODONE HCL 5 MG PO TABS: 5.0000 mg | ORAL_TABLET | Freq: Once | ORAL | Status: DC | PRN

## 2018-11-04 MED ORDER — OXYCODONE HCL 5 MG PO TABS
ORAL_TABLET | ORAL | Status: AC
  Filled 2018-11-04: qty 1

## 2018-11-04 MED ORDER — LIDOCAINE 2% (20 MG/ML) 5 ML SYRINGE
INTRAMUSCULAR | Status: DC | PRN
  Administered 2018-11-04: 80 mg via INTRAVENOUS

## 2018-11-04 MED ORDER — 0.9 % SODIUM CHLORIDE (POUR BTL) OPTIME
TOPICAL | Status: DC | PRN
  Administered 2018-11-04: 1000 mL

## 2018-11-04 MED ORDER — DIPHENHYDRAMINE HCL 25 MG PO CAPS
ORAL_CAPSULE | ORAL | Status: AC
  Administered 2018-11-04: 25 mg
  Filled 2018-11-04: qty 1

## 2018-11-04 MED ORDER — DEXAMETHASONE SODIUM PHOSPHATE 10 MG/ML IJ SOLN
INTRAMUSCULAR | Status: DC | PRN
  Administered 2018-11-04: 10 mg via INTRAVENOUS

## 2018-11-04 MED ORDER — ONDANSETRON HCL 4 MG PO TABS
4.0000 mg | ORAL_TABLET | Freq: Four times a day (QID) | ORAL | Status: DC | PRN
  Filled 2018-11-04: qty 1

## 2018-11-04 MED ORDER — MIDAZOLAM HCL 5 MG/5ML IJ SOLN
INTRAMUSCULAR | Status: DC | PRN
  Administered 2018-11-04: 2 mg via INTRAVENOUS

## 2018-11-04 MED ORDER — MIDAZOLAM HCL 2 MG/2ML IJ SOLN
INTRAMUSCULAR | Status: AC
  Filled 2018-11-04: qty 2

## 2018-11-04 MED ORDER — DOCUSATE SODIUM 100 MG PO CAPS: 100.0000 mg | ORAL_CAPSULE | Freq: Two times a day (BID) | ORAL | 3 refills | Status: AC

## 2018-11-04 MED ORDER — SENNA 8.6 MG PO TABS: 2.0000 | ORAL_TABLET | Freq: Every day | ORAL | 3 refills | Status: DC

## 2018-11-04 MED ORDER — CLINDAMYCIN PHOSPHATE 900 MG/50ML IV SOLN
900.0000 mg | INTRAVENOUS | Status: AC
  Administered 2018-11-04: 900 mg via INTRAVENOUS
  Filled 2018-11-04: qty 50

## 2018-11-04 MED ORDER — LIDOCAINE-EPINEPHRINE 1 %-1:100000 IJ SOLN
INTRAMUSCULAR | Status: AC
  Filled 2018-11-04: qty 1

## 2018-11-04 MED ORDER — METOCLOPRAMIDE HCL 5 MG/ML IJ SOLN: 5.0000 mg | Freq: Three times a day (TID) | INTRAMUSCULAR | Status: DC | PRN

## 2018-11-04 MED ORDER — PROPOFOL 10 MG/ML IV BOLUS
INTRAVENOUS | Status: DC | PRN
  Administered 2018-11-04: 200 mg via INTRAVENOUS

## 2018-11-04 MED ORDER — OXYCODONE HCL 5 MG/5ML PO SOLN: 5.0000 mg | Freq: Once | ORAL | Status: DC | PRN

## 2018-11-04 MED ORDER — LACTATED RINGERS IV SOLN: INTRAVENOUS | Status: DC

## 2018-11-04 MED ORDER — MIDAZOLAM HCL 2 MG/2ML IJ SOLN: 2.0000 mg | Freq: Once | INTRAMUSCULAR | Status: DC

## 2018-11-04 MED ORDER — BUPIVACAINE-EPINEPHRINE 0.25% -1:200000 IJ SOLN
INTRAMUSCULAR | Status: DC | PRN
  Administered 2018-11-04: 20 mL

## 2018-11-04 MED ORDER — ONDANSETRON HCL 4 MG/2ML IJ SOLN
INTRAMUSCULAR | Status: AC
  Filled 2018-11-04: qty 2

## 2018-11-04 SURGICAL SUPPLY — 31 items
BANDAGE ACE 6X5 VEL STRL LF (GAUZE/BANDAGES/DRESSINGS) ×2 IMPLANT
BANDAGE ELASTIC 6 VELCRO ST LF (GAUZE/BANDAGES/DRESSINGS) ×1 IMPLANT
BLADE SHAVER TORPEDO 4X13 (MISCELLANEOUS) ×1 IMPLANT
BLADE SURG SZ11 CARB STEEL (BLADE) ×2 IMPLANT
CHLORAPREP W/TINT 26ML (MISCELLANEOUS) ×2 IMPLANT
COVER SURGICAL LIGHT HANDLE (MISCELLANEOUS) ×2 IMPLANT
COVER WAND RF STERILE (DRAPES) IMPLANT
CUFF TOURN SGL QUICK 34 (TOURNIQUET CUFF) ×2
CUFF TRNQT CYL 34X4.125X (TOURNIQUET CUFF) IMPLANT
DRAPE U-SHAPE 47X51 STRL (DRAPES) ×2 IMPLANT
DRSG ADAPTIC 3X8 NADH LF (GAUZE/BANDAGES/DRESSINGS) ×1 IMPLANT
DRSG EMULSION OIL 3X3 NADH (GAUZE/BANDAGES/DRESSINGS) ×2 IMPLANT
DRSG PAD ABDOMINAL 8X10 ST (GAUZE/BANDAGES/DRESSINGS) ×2 IMPLANT
DW OUTFLOW CASSETTE/TUBE SET (MISCELLANEOUS) ×1 IMPLANT
EXCALIBUR 3.8MM X 13CM (MISCELLANEOUS) ×1 IMPLANT
GAUZE SPONGE 4X4 12PLY STRL (GAUZE/BANDAGES/DRESSINGS) ×3 IMPLANT
GLOVE BIO SURGEON STRL SZ8.5 (GLOVE) ×2 IMPLANT
GLOVE BIOGEL PI IND STRL 8.5 (GLOVE) ×1 IMPLANT
GLOVE BIOGEL PI INDICATOR 8.5 (GLOVE) ×1
GOWN STRL REUS W/TWL 2XL LVL3 (GOWN DISPOSABLE) ×2 IMPLANT
GOWN STRL REUS W/TWL LRG LVL3 (GOWN DISPOSABLE) ×2 IMPLANT
MANIFOLD NEPTUNE II (INSTRUMENTS) ×2 IMPLANT
NDL SAFETY ECLIPSE 18X1.5 (NEEDLE) ×1 IMPLANT
NEEDLE HYPO 18GX1.5 SHARP (NEEDLE) ×2
PACK ARTHROSCOPY WL (CUSTOM PROCEDURE TRAY) ×2 IMPLANT
PADDING CAST COTTON 6X4 STRL (CAST SUPPLIES) ×2 IMPLANT
SUT ETHILON 3 0 PS 1 (SUTURE) ×2 IMPLANT
SYR 20CC LL (SYRINGE) ×4 IMPLANT
TOWEL OR 17X26 10 PK STRL BLUE (TOWEL DISPOSABLE) ×2 IMPLANT
TUBING ARTHROSCOPY IRRIG 16FT (MISCELLANEOUS) ×2 IMPLANT
WRAP KNEE MAXI GEL POST OP (GAUZE/BANDAGES/DRESSINGS) ×1 IMPLANT

## 2018-11-04 NOTE — Transfer of Care (Signed)
Immediate Anesthesia Transfer of Care Note  Patient: Monica Kemp  Procedure(s) Performed: KNEE ARTHROSCOPY WITH PARTIAL  MEDIAL MENISECTOMY (Right Knee)  Patient Location: PACU  Anesthesia Type:General  Level of Consciousness: drowsy and patient cooperative  Airway & Oxygen Therapy: Patient Spontanous Breathing and Patient connected to face mask oxygen  Post-op Assessment: Report given to RN and Post -op Vital signs reviewed and stable  Post vital signs: Reviewed and stable  Last Vitals:  Vitals Value Taken Time  BP 123/76 11/04/2018  9:19 AM  Temp    Pulse 83 11/04/2018  9:20 AM  Resp 15 11/04/2018  9:20 AM  SpO2 100 % 11/04/2018  9:20 AM  Vitals shown include unvalidated device data.  Last Pain:  Vitals:   11/04/18 0628  TempSrc:   PainSc: 3       Patients Stated Pain Goal: 5 (56/31/49 7026)  Complications: No apparent anesthesia complications

## 2018-11-04 NOTE — Anesthesia Procedure Notes (Signed)
Procedure Name: LMA Insertion Date/Time: 11/04/2018 7:50 AM Performed by: Montel Clock, CRNA Pre-anesthesia Checklist: Patient identified, Emergency Drugs available, Suction available, Patient being monitored and Timeout performed Patient Re-evaluated:Patient Re-evaluated prior to induction Oxygen Delivery Method: Circle system utilized Preoxygenation: Pre-oxygenation with 100% oxygen Induction Type: IV induction LMA: LMA with gastric port inserted LMA Size: 4.0 Number of attempts: 1 Dental Injury: Teeth and Oropharynx as per pre-operative assessment

## 2018-11-04 NOTE — Discharge Instructions (Signed)
Dr. Rod Can Hip & Knee Reconstruction [facility] [address], [place], [place] [address] [phone]   Arthroscopic Procedure, Knee An arthroscopic procedure can find what is wrong with your knee. PROCEDURE Arthroscopy is a surgical technique that allows your orthopedic surgeon to diagnose and treat your knee injury with accuracy. They will look into your knee through a small instrument. This is almost like a small (pencil sized) telescope. Because arthroscopy affects your knee less than open knee surgery, you can anticipate a more rapid recovery. Taking an active role by following your caregiver's instructions will help with rapid and complete recovery. Use crutches, rest, elevation, ice, and knee exercises as instructed. The length of recovery depends on various factors including type of injury, age, physical condition, medical conditions, and your rehabilitation. Your knee is the joint between the large bones (femur and tibia) in your leg. Cartilage covers these bone ends which are smooth and slippery and allow your knee to bend and move smoothly. Two menisci, thick, semi-lunar shaped pads of cartilage which form a rim inside the joint, help absorb shock and stabilize your knee. Ligaments bind the bones together and support your knee joint. Muscles move the joint, help support your knee, and take stress off the joint itself. Because of this all programs and physical therapy to rehabilitate an injured or repaired knee require rebuilding and strengthening your muscles. AFTER THE PROCEDURE  After the procedure, you will be moved to a recovery area until most of the effects of the medication have worn off. Your caregiver will discuss the test results with you.   Only take over-the-counter or prescription medicines for pain, discomfort, or fever as directed by your caregiver.  SEEK MEDICAL CARE IF:   You have increased bleeding from your wounds.   You see  redness, swelling, or have increasing pain in your wounds.   You have pus coming from your wound.   You have an oral temperature above 102 F (38.9 C).   You notice a bad smell coming from the wound or dressing.   You have severe pain with any motion of your knee.  SEEK IMMEDIATE MEDICAL CARE IF:   You develop a rash.   You have difficulty breathing.   You have any allergic problems.  FURTHER INSTRUCTIONS:   ICE to the affected knee every three hours for 30 minutes at a time and then as needed for pain and swelling.  Continue to use ice on the knee for pain and swelling from surgery. You may notice swelling that will progress down to the foot and ankle.  This is normal after surgery.  Elevate the leg when you are not up walking on it.    DIET You may resume your previous home diet once your are discharged from the hospital.  DRESSING / WOUND CARE / SHOWERING You may change your dressing 3-5 days after surgery.  Then change the dressing every day with sterile gauze.  Please use good hand washing techniques before changing the dressing.  Do not use any lotions or creams on the incision until instructed by your surgeon. You may start showering two days after being discharged home but do not submerge the incisions under water.  Change dressing 48 hours after the procedure and then cover the small incisions with band aids until your follow up visit. Change the surgical dressings daily and reapply a dry dressing each time.   ACTIVITY Walk with your walker as instructed. Use walker as long  as suggested by your caregivers. Avoid periods of inactivity such as sitting longer than an hour when not asleep. This helps prevent blood clots.  You may resume a sexual relationship in one month or when given the OK by your doctor.  You may return to work once you are cleared by your doctor.  Do not drive a car for 6 weeks or until released by you surgeon.  Do not drive while taking  narcotics.  WEIGHT BEARING Weight bearing as tolerated with assist device (walker, cane, etc) as directed, use it as long as suggested by your surgeon or therapist, typically at least 4-6 weeks.  POSTOPERATIVE CONSTIPATION PROTOCOL Constipation - defined medically as fewer than three stools per week and severe constipation as less than one stool per week.  One of the most common issues patients have following surgery is constipation.  Even if you have a regular bowel pattern at home, your normal regimen is likely to be disrupted due to multiple reasons following surgery.  Combination of anesthesia, postoperative narcotics, change in appetite and fluid intake all can affect your bowels.  In order to avoid complications following surgery, here are some recommendations in order to help you during your recovery period.  Colace (docusate) - Pick up an over-the-counter form of Colace or another stool softener and take twice a day as long as you are requiring postoperative pain medications.  Take with a full glass of water daily.  If you experience loose stools or diarrhea, hold the colace until you stool forms back up.  If your symptoms do not get better within 1 week or if they get worse, check with your doctor.  Dulcolax (bisacodyl) - Pick up over-the-counter and take as directed by the product packaging as needed to assist with the movement of your bowels.  Take with a full glass of water.  Use this product as needed if not relieved by Colace only.   MiraLax (polyethylene glycol) - Pick up over-the-counter to have on hand.  MiraLax is a solution that will increase the amount of water in your bowels to assist with bowel movements.  Take as directed and can mix with a glass of water, juice, soda, coffee, or tea.  Take if you go more than two days without a movement. Do not use MiraLax more than once per day. Call your doctor if you are still constipated or irregular after using this medication for 7 days  in a row.  If you continue to have problems with postoperative constipation, please contact the office for further assistance and recommendations.  If you experience "the worst abdominal pain ever" or develop nausea or vomiting, please contact the office immediatly for further recommendations for treatment.  ITCHING  If you experience itching with your medications, try taking only a single pain pill, or even half a pain pill at a time.  You can also use Benadryl over the counter for itching or also to help with sleep.   TED HOSE STOCKINGS Wear the elastic stockings on both legs for three weeks following surgery during the day but you may remove then at night for sleeping.  MEDICATIONS See your medication summary on the After Visit Summary that the nursing staff will review with you prior to discharge.  You may have some home medications which will be placed on hold until you complete the course of blood thinner medication.  It is important for you to complete the blood thinner medication as prescribed by your surgeon.  Continue  your approved medications as instructed at time of discharge. Do not drive while taking narcotics.   PRECAUTIONS If you experience chest pain or shortness of breath - call 911 immediately for transfer to the hospital emergency department.  If you develop a fever greater that 101 F, purulent drainage from wound, increased redness or drainage from wound, foul odor from the wound/dressing, or calf pain - CONTACT YOUR SURGEON.                                                   FOLLOW-UP APPOINTMENTS Make sure you keep all of your appointments after your operation with your surgeon and caregivers. You should call the office at [phone]  and make an appointment for approximately one week after the date of your surgery or on the date instructed by your surgeon outlined in the "After Visit Summary".  RANGE OF MOTION AND STRENGTHENING EXERCISES  Rehabilitation of the knee  is important following a knee injury or an operation. After just a few days of immobilization, the muscles of the thigh which control the knee become weakened and shrink (atrophy). Knee exercises are designed to build up the tone and strength of the thigh muscles and to improve knee motion. Often times heat used for twenty to thirty minutes before working out will loosen up your tissues and help with improving the range of motion but do not use heat for the first two weeks following surgery. These exercises can be done on a training (exercise) mat, on the floor, on a table or on a bed. Use what ever works the best and is most comfortable for you Knee exercises include:  QUAD STRENGTHENING EXERCISES Strengthening Quadriceps Sets  Tighten muscles on top of thigh by pushing knees down into floor or table. Hold for 20 seconds. Repeat 10 times. Do 2 sessions per day.     Strengthening Terminal Knee Extension  With knee bent over bolster, straighten knee by tightening muscle on top of thigh. Be sure to keep bottom of knee on bolster. Hold for 20 seconds. Repeat 10 times. Do 2 sessions per day.   Straight Leg with Bent Knee  Lie on back with opposite leg bent. Keep involved knee slightly bent at knee and raise leg 4-6". Hold for 10 seconds. Repeat 20 times per set. Do 2 sets per session. Do 2 sessions per day.

## 2018-11-04 NOTE — Progress Notes (Signed)
Dr Delfino Lovett will place [facility] [address] after he completes current procedure

## 2018-11-04 NOTE — Op Note (Signed)
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  Brief clinical note- The patient is a 48 year old female who injured her right knee in early January 2020. She developed a symptomatic posterior medial meniscal root / body tear.  She failed conservative management with injection, medial unloader brace, and activity modification. She was indicated for partial medial meniscectomy. We discussed the risks, benefits, and alternatives, and she wished to proceed.  Procedure in detail -       After successful administration of General anesthetic, a tourmiquet is placed high on the Right  thigh and the Right lower extremity is prepped and draped in the usual sterile fashion. Time out is performed by the surgical team. 20 cc of 1% lidocaine with epinephrine were injected into the suprapatellar pouch. Standard inferolateral portal site is marked and incision made with an 11 blade. Arthroscope in inserted and inflow is initiated. Arthroscopic visualization proceeds.      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 The medial and lateral gutters are visualized and there are no loose bodies. Flexion and valgus force is applied to the knee and the medial compartment is entered. A spinal needle is passed into the joint through the site marked for the inferomedial portal. A small incision is made and the dilator passed into the joint. The findings for the medial compartment are complex posterior root / body meniscal tear with cleavage component. The tear is debrided to a stable base with baskets and a shaver. The shaver is used  to debride the unstable cartilage to a stable cartilaginous base with stable edges. It is probed and found to be stable.    The intercondylar notch is visualized and the ACL was probed and found to be normal. The lateral compartment is entered and the findings are intact meniscus and cartilage. The meniscus is probed and found to be stable.  [place] was used to perform chondroplasty of the median ridge of the patella.     The joint is again inspected and there are no other tears, defects or loose bodies identified. The arthroscopic equipment is then removed from the portals which are closed with interrupted 3-0 nylon. 20 ml of .25% Marcaine with epinephrine are injected through the suprapatellar pouch. The incisions are cleaned and dried and a bulky sterile dressing is applied. The patient is then awakened and transported to recovery in stable condition.   11/04/2018, 9:18 AM

## 2018-11-04 NOTE — Progress Notes (Signed)
DIscharge instructions printed prescriptions, Dr Delfino Lovett contacted and informed RN he sent scripts electronically.  [facility] [address].  No electronic scripts for pt.  [facility]

## 2018-11-04 NOTE — H&P (Signed)
PREOPERATIVE H&P  Chief Complaint: Medial meniscal tear, right knee  HPI: Monica Kemp is a 48 y.o. female who presents for preoperative history and physical with a diagnosis of Medial meniscal tear, right knee. Symptoms are rated as moderate to severe, and have been worsening.  This is significantly impairing activities of daily living.  She has elected for surgical management.   Past Medical History:  Diagnosis Date  . Arthritis    right knee  . Complication of anesthesia   . Headache    migraines  . Meniere's disease    left ear  . PONV (postoperative nausea and vomiting)    nauseated  . Seizures ([place])    one time 15 years ago  . UTI (lower urinary tract infection)    Past Surgical History:  Procedure Laterality Date  . CERVICAL DISC ARTHROPLASTY Right 10/31/2015   Procedure: CERVICAL ANTERIOR DISC ARTHROPLASTY RIGHT C4 - C5;  Surgeon: Melina Schools, MD;  Location: [facility];  Service: Orthopedics;  Laterality: Right;  . CERVICAL DISC SURGERY    . partial discectomy    . TONSILLECTOMY     Social History   Socioeconomic History  . Marital status: Married    Spouse name: Not on file  . Number of children: Not on file  . Years of education: Not on file  . Highest education level: Not on file  Occupational History  . Not on file  Social Needs  . Financial resource strain: Not on file  . Food insecurity:    Worry: Not on file    Inability: Not on file  . Transportation needs:    Medical: Not on file    Non-medical: Not on file  Tobacco Use  . Smoking status: Former Smoker    Types: Cigarettes    Last attempt to quit: 10/22/2003    Years since quitting: 15.0  . Smokeless tobacco: Never Used  Substance and Sexual Activity  . Alcohol use: No  . Drug use: No  . Sexual activity: Yes  Lifestyle  . Physical activity:    Days per week: Not on file    Minutes per session: Not on file  . Stress: Not on file  Relationships  . Social connections:    Talks on phone: Not on  file    Gets together: Not on file    Attends religious service: Not on file    Active member of club or organization: Not on file    Attends meetings of clubs or organizations: Not on file    Relationship status: Not on file  Other Topics Concern  . Not on file  Social History Narrative  . Not on file   History reviewed. No pertinent family history. Allergies  Allergen Reactions  . Ceftin [Cefuroxime Axetil] Itching  . Codeine Itching  . Morphine And Related Hives and Itching  . Sulfa Antibiotics Itching   Prior to Admission medications   Medication Sig Start Date End Date Taking? Authorizing Provider  BLACK ELDERBERRY PO Take 50 mg by mouth daily.   Yes [provider]  estradiol (VIVELLE-DOT) 0.075 MG/24HR Place 1 patch onto the skin 2 (two) times a week. Changes on Monday and Thursday   Yes [provider]  fluticasone (FLONASE) 50 MCG/ACT nasal spray Place 1 spray into both nostrils daily.   Yes [provider]  Lifitegrast Shirley Friar) 5 % SOLN Place 1 drop into both eyes daily.   Yes [provider]  magnesium oxide (MAG-OX) 400 MG  tablet Take 400 mg by mouth daily.   Yes [provider]  omega-3 acid ethyl esters (LOVAZA) 1 g capsule Take 1,600 mg by mouth daily.   Yes [provider]  progesterone (PROMETRIUM) 100 MG capsule Take 100 mg by mouth at bedtime.   Yes [provider]  vitamin C (ASCORBIC ACID) 500 MG tablet Take 240 mg by mouth daily.   Yes [provider]  aspirin EC 81 MG tablet Take 1 tablet (81 mg total) by mouth daily. Start on POD #2 10/31/15   Melina Schools, MD  methocarbamol (ROBAXIN) 500 MG tablet Take 1 tablet (500 mg total) by mouth 3 (three) times daily as needed for muscle spasms. Patient not taking: Reported on 10/20/2018 10/31/15   Melina Schools, MD  ondansetron (ZOFRAN) 4 MG tablet Take 1 tablet (4 mg total) by mouth every 8 (eight) hours as needed for nausea or vomiting. Patient not  taking: Reported on 10/20/2018 10/31/15   Melina Schools, MD  oxyCODONE-acetaminophen (PERCOCET) 10-325 MG tablet Take 1 tablet by mouth every 4 (four) hours as needed for pain. Patient taking differently: Take 1 tablet by mouth every 4 (four) hours as needed for pain. Not taking 10/31/15   Melina Schools, MD  oxyCODONE-acetaminophen (PERCOCET) 10-325 MG tablet Take 1 tablet by mouth every 4 (four) hours as needed for pain. Patient taking differently: Take 1 tablet by mouth every 4 (four) hours as needed for pain. Not taking 11/01/15   Melina Schools, MD  rizatriptan (MAXALT-MLT) 5 MG disintegrating tablet Take 5 mg by mouth as needed for migraine. May repeat in 2 hours if needed    [provider]     Positive ROS: All other systems have been reviewed and were otherwise negative with the exception of those mentioned in the HPI and as above.  Physical Exam: General: Alert, no acute distress Cardiovascular: No pedal edema Respiratory: No cyanosis, no use of accessory musculature GI: No organomegaly, abdomen is soft and non-tender Skin: No lesions in the area of chief complaint Neurologic: Sensation intact distally Psychiatric: Patient is competent for consent with normal mood and affect Lymphatic: No axillary or cervical lymphadenopathy  MUSCULOSKELETAL: R knee mildly swollen. Skin intact. (+) TTP medial joint line with (+) [place]. NVI   Assessment: Medial meniscal tear, right knee  Plan: Plan for Procedure(s): KNEE ARTHROSCOPY WITH MEDIAL MENISECTOMY  The risks benefits and alternatives were discussed with the patient including but not limited to the risks of nonoperative treatment, versus surgical intervention including infection, bleeding, nerve injury,  blood clots, cardiopulmonary complications, morbidity, mortality, among others, and they were willing to proceed.   Bertram Savin, MD Cell [phone]   11/04/2018 7:40 AM

## 2018-11-04 NOTE — Anesthesia Postprocedure Evaluation (Signed)
Anesthesia Post Note  Patient: Monica Kemp  Procedure(s) Performed: KNEE ARTHROSCOPY WITH PARTIAL  MEDIAL MENISECTOMY (Right Knee)     Patient location during evaluation: PACU Anesthesia Type: General Level of consciousness: sedated and patient cooperative Pain management: pain level controlled Vital Signs Assessment: post-procedure vital signs reviewed and stable Respiratory status: spontaneous breathing Cardiovascular status: stable Anesthetic complications: no    Last Vitals:  Vitals:   11/04/18 1015 11/04/18 1025  BP: 115/75 132/82  Pulse: 70 68  Resp: 10 12  Temp: 36.6 C 36.7 C  SpO2: 95% 98%    Last Pain:  Vitals:   11/04/18 1025  TempSrc:   PainSc: [place]

## 2018-11-11 ENCOUNTER — Encounter (HOSPITAL_COMMUNITY): Payer: Self-pay | Admitting: Orthopedic Surgery

## 2019-01-31 ENCOUNTER — Other Ambulatory Visit: Payer: Self-pay | Admitting: Orthopedic Surgery

## 2019-01-31 DIAGNOSIS — M259 Joint disorder, unspecified: Secondary | ICD-10-CM

## 2019-02-01 ENCOUNTER — Other Ambulatory Visit: Payer: Self-pay | Admitting: Orthopedic Surgery

## 2019-02-01 DIAGNOSIS — M259 Joint disorder, unspecified: Secondary | ICD-10-CM

## 2019-02-18 ENCOUNTER — Ambulatory Visit
Admission: RE | Admit: 2019-02-18 | Discharge: 2019-02-18 | Disposition: A | Discharge status: Home / Self Care | Payer: 59 | Source: Ambulatory Visit | Attending: Orthopedic Surgery | Admitting: Orthopedic Surgery

## 2019-02-18 DIAGNOSIS — M259 Joint disorder, unspecified: Secondary | ICD-10-CM

## 2019-02-21 ENCOUNTER — Other Ambulatory Visit: Payer: Self-pay | Admitting: Family Medicine

## 2019-02-21 ENCOUNTER — Other Ambulatory Visit: Payer: Self-pay | Admitting: Orthopedic Surgery

## 2021-01-08 ENCOUNTER — Ambulatory Visit
Admission: RE | Admit: 2021-01-08 | Discharge: 2021-01-08 | Disposition: A | Discharge status: Home / Self Care | Payer: Self-pay | Source: Ambulatory Visit | Attending: Family Medicine | Admitting: Family Medicine

## 2021-01-08 ENCOUNTER — Other Ambulatory Visit: Payer: Self-pay | Admitting: Family Medicine

## 2021-01-08 DIAGNOSIS — R1012 Left upper quadrant pain: Secondary | ICD-10-CM

## 2021-01-08 MED ORDER — IOPAMIDOL (ISOVUE-300) INJECTION 61%
100.0000 mL | Freq: Once | INTRAVENOUS | Status: AC | PRN
  Administered 2021-01-08: 100 mL via INTRAVENOUS

## 2021-01-10 ENCOUNTER — Ambulatory Visit
Admission: RE | Admit: 2021-01-10 | Discharge: 2021-01-10 | Disposition: A | Discharge status: Home / Self Care | Payer: 59 | Source: Ambulatory Visit | Attending: Physician Assistant | Admitting: Physician Assistant

## 2021-01-10 ENCOUNTER — Other Ambulatory Visit: Payer: Self-pay | Admitting: Physician Assistant

## 2021-01-10 DIAGNOSIS — R9389 Abnormal findings on diagnostic imaging of other specified body structures: Secondary | ICD-10-CM

## 2021-05-01 ENCOUNTER — Ambulatory Visit: Payer: 59 | Admitting: Physician Assistant

## 2021-05-01 ENCOUNTER — Other Ambulatory Visit: Payer: Self-pay

## 2021-05-01 ENCOUNTER — Encounter: Payer: Self-pay | Admitting: Physician Assistant

## 2021-05-01 DIAGNOSIS — A630 Anogenital (venereal) warts: Secondary | ICD-10-CM

## 2021-05-01 MED ORDER — CIMETIDINE 800 MG PO TABS: 800.0000 mg | ORAL_TABLET | Freq: Three times a day (TID) | ORAL | 6 refills | Status: DC

## 2021-05-01 MED ORDER — CONDYLOX 0.5 % EX GEL: CUTANEOUS | 3 refills | Status: AC

## 2021-05-01 MED ORDER — CICLOPIROX OLAMINE 0.77 % EX CREA: TOPICAL_CREAM | Freq: Two times a day (BID) | CUTANEOUS | 4 refills | Status: DC

## 2021-05-01 NOTE — Progress Notes (Signed)
   New Patient   Subjective  Monica Kemp is a 50 y.o. female who presents for the following: New Patient (Initial Visit of former patient.)(Patient here today for warts on her buttocks x years. Per patient she's had them LN2 in the past as well as zyclara. She wants to know if there's any other treatment options?). She also wants a skin examination. No history of melanoma or non-mole skin cancers.   The following portions of the chart were reviewed this encounter and updated as appropriate:  Tobacco  Allergies  Meds  Problems  Med Hx  Surg Hx  Fam Hx      Objective  Well appearing patient in no apparent distress; mood and affect are within normal limits.  A full examination was performed including scalp, head, eyes, ears, nose, lips, neck, chest, axillae, abdomen, back, buttocks, bilateral upper extremities, bilateral lower extremities, hands, feet, fingers, toes, fingernails, and toenails. All findings within normal limits unless otherwise noted below.  Buttocks Verrucous papules -- Discussed viral etiology and contagion.    Assessment & Plan  Condyloma acuminatum Buttocks  cimetidine (TAGAMET) 800 MG tablet - Buttocks Take 1 tablet (800 mg total) by mouth 3 (three) times daily.  podofilox (CONDYLOX) 0.5 % gel - Buttocks Apply topically as directed. Apply medication to AA for 3 days. Take 4 days off.  Repeat weekly up to 4 weeks. If too much irritation, apply less often.   No atypical nevi noted at the time of the visit.   I, Arneta Mahmood, PA-C, have reviewed all documentation's for this visit.  The documentation on 05/01/21 for the exam, diagnosis, procedures and orders are all accurate and complete.

## 2021-07-16 ENCOUNTER — Other Ambulatory Visit: Payer: Self-pay

## 2021-07-16 ENCOUNTER — Ambulatory Visit
Admission: RE | Admit: 2021-07-16 | Discharge: 2021-07-16 | Disposition: A | Discharge status: Home / Self Care | Payer: 59 | Source: Ambulatory Visit | Attending: Physician Assistant | Admitting: Physician Assistant

## 2021-07-16 VITALS — BP 148/90 | HR 85 | Temp 98.5°F | Resp 18

## 2021-07-16 DIAGNOSIS — B379 Candidiasis, unspecified: Secondary | ICD-10-CM | POA: Diagnosis not present

## 2021-07-16 DIAGNOSIS — J0140 Acute pansinusitis, unspecified: Secondary | ICD-10-CM | POA: Diagnosis not present

## 2021-07-16 DIAGNOSIS — T3695XA Adverse effect of unspecified systemic antibiotic, initial encounter: Secondary | ICD-10-CM | POA: Diagnosis not present

## 2021-07-16 MED ORDER — DOXYCYCLINE HYCLATE 100 MG PO CAPS: 100.0000 mg | ORAL_CAPSULE | Freq: Two times a day (BID) | ORAL | 0 refills | Status: AC

## 2021-07-16 MED ORDER — PREDNISONE 20 MG PO TABS: 40.0000 mg | ORAL_TABLET | Freq: Every day | ORAL | 0 refills | Status: AC

## 2021-07-16 MED ORDER — FLUCONAZOLE 150 MG PO TABS
150.0000 mg | ORAL_TABLET | Freq: Once | ORAL | 0 refills | Status: AC
Start: 2021-07-16 — End: 2021-07-16

## 2021-07-16 NOTE — ED Provider Notes (Signed)
[facility]    CSN: [id number] Arrival date & time: 07/16/21  1644      History   Chief Complaint Chief Complaint  Patient presents with   Generalized Body Aches    HPI Monica Kemp is a 50 y.o. female.   Patient presents today with a 6 to 7-day history of URI symptoms.  Reports initially had body aches, cough, sore throat, fever but the symptoms essentially resolved.  Reports that nasal congestion and headache are her primary symptoms.  She has been using over-the-counter medications including Afrin at night, Flonase, multisymptom medications including decongestants without improvement of symptoms.  She does have a history of allergies but reports that they are well controlled with as needed Flonase.  Denies any history of asthma, COPD, smoking.  Denies any recent antibiotic use.  She does report sick contacts with similar symptoms.  She denies history of diabetes.   Past Medical History:  Diagnosis Date   Arthritis    right knee   Complication of anesthesia    Headache    migraines   Meniere's disease    left ear   PONV (postoperative nausea and vomiting)    nauseated   Seizures (HCC)    one time 15 years ago   UTI (lower urinary tract infection)     Patient Active Problem List   Diagnosis Date Noted   Neck pain 10/31/2015    Past Surgical History:  Procedure Laterality Date   CERVICAL DISC ARTHROPLASTY Right 10/31/2015   Procedure: CERVICAL ANTERIOR [place] ARTHROPLASTY RIGHT C4 - C5;  Surgeon: Melina Schools, MD;  Location: [place];  Service: Orthopedics;  Laterality: Right;   CERVICAL DISC SURGERY     KNEE ARTHROSCOPY WITH MEDIAL MENISECTOMY Right 11/04/2018   Procedure: KNEE ARTHROSCOPY WITH PARTIAL  MEDIAL MENISECTOMY;  Surgeon: Rod Can, MD;  Location: [facility];  Service: Orthopedics;  Laterality: Right;   partial discectomy     TONSILLECTOMY      OB History   No obstetric history on file.      Home Medications    Prior to Admission  medications   Medication Sig Start Date End Date Taking? Authorizing Provider  doxycycline (VIBRAMYCIN) 100 MG capsule Take 1 capsule (100 mg total) by mouth 2 (two) times daily. 07/16/21  Yes Herschel Fleagle K, PA-C  fluconazole (DIFLUCAN) 150 MG tablet Take 1 tablet (150 mg total) by mouth once for 1 dose. 07/16/21 07/16/21 Yes Massai Hankerson, Junie Panning K, PA-C  predniSONE (DELTASONE) 20 MG tablet Take 2 tablets (40 mg total) by mouth daily for 4 days. 07/16/21 07/20/21 Yes Jhan Conery, Derry Skill, PA-C  Ascorbic Acid (VITAMIN C) 500 MG CAPS See admin instructions.    [provider]  Calcium Carb-Cholecalciferol (CALCIUM 500 + D) 500-125 MG-UNIT TABS 1 tablet with a meal    [provider]  Cholecalciferol (VITAMIN D3) 125 MCG (5000 UT) TABS See admin instructions.    [provider]  cimetidine (TAGAMET) 800 MG tablet Take 1 tablet (800 mg total) by mouth 3 (three) times daily. 05/01/21   Sheffield, Ronalee Red, PA-C  Cyanocobalamin (VITAMIN B12) 1000 MCG TBCR 1 tablet    [provider]  diclofenac (VOLTAREN) 75 MG EC tablet diclofenac sodium 75 mg tablet,delayed release  TAKE 1 TABLET BY MOUTH TWICE DAILY    [provider]  docusate sodium (COLACE) 100 MG capsule Take 1 capsule (100 mg total) by mouth 2 (two) times daily. 11/04/18   Rod Can, MD  estradiol (  VIVELLE-DOT) 0.075 MG/24HR Place 1 patch onto the skin 2 (two) times a week. Changes on Monday and Thursday    [provider]  estradiol (VIVELLE-DOT) 0.075 MG/24HR estradiol 0.075 mg/24 hr semiweekly transdermal patch  APP 1 PA EXT TO THE SKIN 2 TIMES A WK    [provider]  fluticasone (FLONASE) 50 MCG/ACT nasal spray Place 1 spray into both nostrils daily.    [provider]  magnesium oxide (MAG-OX) 400 (240 Mg) MG tablet magnesium oxide 400 mg (241.3 mg magnesium) tablet   400 mg by oral route.    [provider]  omega-3 acid ethyl esters (LOVAZA) 1 g capsule Take 1,600 mg  by mouth daily.    [provider]  podofilox (CONDYLOX) 0.5 % gel Apply topically as directed. Apply medication to AA for 3 days. Take 4 days off.  Repeat weekly x up to 4 weeks. If too much irritation, apply less often. 05/01/21   Sheffield, Ronalee Red, PA-C  Probiotic TBEC See admin instructions.    [provider]  progesterone (PROMETRIUM) 100 MG capsule progesterone micronized 100 mg capsule  TK 1 C PO HS    [provider]  vitamin C (ASCORBIC ACID) 500 MG tablet Take 240 mg by mouth daily.    [provider]    Family History History reviewed. No pertinent family history.  Social History Social History   Tobacco Use   Smoking status: Former    Types: Cigarettes    Quit date: 10/22/2003    Years since quitting: 17.7   Smokeless tobacco: Never  Vaping Use   Vaping Use: Never used  Substance Use Topics   Alcohol use: No   Drug use: No     Allergies   Buprenorphine hcl, Ceftin [cefuroxime axetil], Codeine, Hydrocodone, Morphine and related, and Sulfa antibiotics   Review of Systems Review of Systems  Constitutional:  Positive for activity change. Negative for appetite change, fatigue and fever.  HENT:  Positive for congestion, postnasal drip and sinus pressure. Negative for sneezing and sore throat.   Respiratory:  Negative for cough and shortness of breath.   Cardiovascular:  Negative for chest pain.  Gastrointestinal:  Negative for abdominal pain, diarrhea, nausea and vomiting.  Neurological:  Positive for headaches. Negative for dizziness and light-headedness.    Physical Exam Triage Vital Signs ED Triage Vitals [07/16/21 1712]  Enc Vitals Group     BP (!) 148/90     Pulse Rate 85     Resp 18     Temp 98.5 F (36.9 C)     Temp Source Oral     SpO2 97 %     Weight      Height      Head Circumference      Peak Flow      Pain Score 0     Pain Loc      Pain Edu?      Excl. in [place]?    No data found.  Updated Vital  Signs BP (!) 148/90 (BP Location: Right Arm)   Pulse 85   Temp 98.5 F (36.9 C) (Oral)   Resp 18   LMP 08/25/2011   SpO2 97%   Visual Acuity Right Eye Distance:   Left Eye Distance:   Bilateral Distance:    Right Eye Near:   Left Eye Near:    Bilateral Near:     Physical Exam Vitals reviewed.  Constitutional:      General:  She is awake. She is not in acute distress.    Appearance: Normal appearance. She is well-developed. She is not ill-appearing.     Comments: Very pleasant female appears stated age in no acute distress sitting comfortably in exam room  HENT:     Head: Normocephalic and atraumatic.     Right Ear: Tympanic membrane, ear canal and external ear normal. Tympanic membrane is not erythematous or bulging.     Left Ear: Tympanic membrane, ear canal and external ear normal. Tympanic membrane is not erythematous or bulging.     Nose:     Right Sinus: Maxillary sinus tenderness and frontal sinus tenderness present.     Left Sinus: Maxillary sinus tenderness and frontal sinus tenderness present.     Mouth/Throat:     Pharynx: Uvula midline. Posterior oropharyngeal erythema present. No oropharyngeal exudate.     Comments: Erythema and drainage in posterior oropharynx Cardiovascular:     Rate and Rhythm: Normal rate and regular rhythm.     Heart sounds: Normal heart sounds, S1 normal and S2 normal. No murmur heard. Pulmonary:     Effort: Pulmonary effort is normal.     Breath sounds: Normal breath sounds. No wheezing, rhonchi or rales.     Comments: Clear to auscultation bilaterally Psychiatric:        Behavior: Behavior is cooperative.     UC Treatments / Results  Labs (all labs ordered are listed, but only abnormal results are displayed) Labs Reviewed - No data to display  EKG   Radiology No results found.  Procedures Procedures (including critical care time)  Medications Ordered in UC Medications - No data to display  Initial Impression /  Assessment and Plan / UC Course  I have reviewed the triage vital signs and the nursing notes.  Pertinent labs & imaging results that were available during my care of the patient were reviewed by me and considered in my medical decision making (see chart for details).     No indication for viral testing given patient has been symptomatic for a week and this would not change management.  Given prolonged and sudden worsening symptoms will cover with antibiotic.  Patient was prescribed doxycycline.  She reports often getting yeast infections following antibiotic use and was given 1 dose of Diflucan to be used as needed.  We will start prednisone for additional symptom relief.  She was instructed not to take NSAIDs including aspirin, Profen/Advil, naproxen/Aleve with this medication as can cause stomach bleeding.  Discussed that she should avoid Afrin use but continue using Flonase as needed.  She is to use antihistamines for additional symptom relief.  Recommend she rest and drink plenty of fluid.  Discussed alarm symptoms that warrant emergent evaluation.  Strict return precautions given to which she expressed understanding.  Final Clinical Impressions(s) / UC Diagnoses   Final diagnoses:  Acute non-recurrent pansinusitis  Antibiotic-induced yeast infection     Discharge Instructions      We are going to start an antibiotic.  Please take doxycycline twice daily.  Please start prednisone.  Do not take NSAIDs including aspirin, ibuprofen/Advil, naproxen/Aleve due to risk of GI bleeding but this medication.  Continue using Flonase as needed.  Avoid Afrin.  If your symptoms not improving please return for reevaluation.  If anything worsens such as fever, chest pain, shortness of breath, nausea, vomiting, severe cough you need to go to the ER.     ED Prescriptions     Medication Sig Dispense  Auth. Provider   predniSONE (DELTASONE) 20 MG tablet Take 2 tablets (40 mg total) by mouth daily for 4  days. 8 tablet Bary Limbach K, PA-C   doxycycline (VIBRAMYCIN) 100 MG capsule Take 1 capsule (100 mg total) by mouth 2 (two) times daily. 20 capsule Camera Krienke K, PA-C   fluconazole (DIFLUCAN) 150 MG tablet Take 1 tablet (150 mg total) by mouth once for 1 dose. 1 tablet Jazon Jipson, Derry Skill, PA-C      PDMP not reviewed this encounter.   Terrilee Croak, PA-C 07/16/21 1726

## 2021-07-16 NOTE — Discharge Instructions (Addendum)
We are going to start an antibiotic.  Please take doxycycline twice daily.  Please start prednisone.  Do not take NSAIDs including aspirin, ibuprofen/Advil, naproxen/Aleve due to risk of GI bleeding but this medication.  Continue using Flonase as needed.  Avoid Afrin.  If your symptoms not improving please return for reevaluation.  If anything worsens such as fever, chest pain, shortness of breath, nausea, vomiting, severe cough you need to go to the ER.

## 2021-07-16 NOTE — ED Triage Notes (Signed)
Pt c/o body aches, headache, nasal congestion without congestion, sore throat (resolved), cough (resolved),   Denies nausea, vomiting, diarrhea, constipation   Onset Wednesday

## 2021-07-31 ENCOUNTER — Emergency Department (HOSPITAL_BASED_OUTPATIENT_CLINIC_OR_DEPARTMENT_OTHER): Payer: 59

## 2021-07-31 ENCOUNTER — Other Ambulatory Visit: Payer: Self-pay

## 2021-07-31 ENCOUNTER — Emergency Department (HOSPITAL_BASED_OUTPATIENT_CLINIC_OR_DEPARTMENT_OTHER)
Admission: EM | Admit: 2021-07-31 | Discharge: 2021-07-31 | Disposition: A | Discharge status: Home / Self Care | Payer: 59 | Attending: Emergency Medicine | Admitting: Emergency Medicine

## 2021-07-31 ENCOUNTER — Encounter (HOSPITAL_BASED_OUTPATIENT_CLINIC_OR_DEPARTMENT_OTHER): Payer: Self-pay | Admitting: Emergency Medicine

## 2021-07-31 DIAGNOSIS — J329 Chronic sinusitis, unspecified: Secondary | ICD-10-CM

## 2021-07-31 DIAGNOSIS — Z7952 Long term (current) use of systemic steroids: Secondary | ICD-10-CM | POA: Insufficient documentation

## 2021-07-31 DIAGNOSIS — Z87891 Personal history of nicotine dependence: Secondary | ICD-10-CM | POA: Insufficient documentation

## 2021-07-31 DIAGNOSIS — R110 Nausea: Secondary | ICD-10-CM | POA: Insufficient documentation

## 2021-07-31 DIAGNOSIS — R42 Dizziness and giddiness: Secondary | ICD-10-CM | POA: Insufficient documentation

## 2021-07-31 DIAGNOSIS — R0981 Nasal congestion: Secondary | ICD-10-CM | POA: Diagnosis present

## 2021-07-31 DIAGNOSIS — J019 Acute sinusitis, unspecified: Secondary | ICD-10-CM | POA: Diagnosis not present

## 2021-07-31 LAB — CBC WITH DIFFERENTIAL/PLATELET
Abs Immature Granulocytes: 0.01 10*3/uL (ref 0.00–0.07)
Basophils Absolute: 0 10*3/uL (ref 0.0–0.1)
Basophils Relative: 0 %
Eosinophils Absolute: 0.1 10*3/uL (ref 0.0–0.5)
Eosinophils Relative: 1 %
HCT: 40.1 % (ref 36.0–46.0)
Hemoglobin: 13.9 g/dL (ref 12.0–15.0)
Immature Granulocytes: 0 %
Lymphocytes Relative: 45 %
Lymphs Abs: 3.1 10*3/uL (ref 0.7–4.0)
MCH: 31.8 pg (ref 26.0–34.0)
MCHC: 34.7 g/dL (ref 30.0–36.0)
MCV: 91.8 fL (ref 80.0–100.0)
Monocytes Absolute: 0.3 10*3/uL (ref 0.1–1.0)
Monocytes Relative: 4 %
Neutro Abs: 3.4 10*3/uL (ref 1.7–7.7)
Neutrophils Relative %: 50 %
Platelets: 225 10*3/uL (ref 150–400)
RBC: 4.37 MIL/uL (ref 3.87–5.11)
RDW: 11.8 % (ref 11.5–15.5)
WBC: 6.8 10*3/uL (ref 4.0–10.5)
nRBC: 0 % (ref 0.0–0.2)

## 2021-07-31 LAB — BASIC METABOLIC PANEL
Anion gap: 8 (ref 5–15)
BUN: 10 mg/dL (ref 6–20)
CO2: 25 mmol/L (ref 22–32)
Calcium: 10 mg/dL (ref 8.9–10.3)
Chloride: 105 mmol/L (ref 98–111)
Creatinine, Ser: 0.88 mg/dL (ref 0.44–1.00)
GFR, Estimated: >60 mL/min (ref >60)
Glucose, Bld: 86 mg/dL (ref 70–99)
Potassium: 3.7 mmol/L (ref 3.5–5.1)
Sodium: 138 mmol/L (ref 135–145)

## 2021-07-31 LAB — MAGNESIUM: Magnesium: 1.9 mg/dL (ref 1.7–2.4)

## 2021-07-31 MED ORDER — PSEUDOEPHEDRINE HCL ER 120 MG PO TB12: 120.0000 mg | ORAL_TABLET | Freq: Two times a day (BID) | ORAL | 0 refills | Status: AC

## 2021-07-31 MED ORDER — OXYMETAZOLINE HCL 0.05 % NA SOLN: 1.0000 | Freq: Two times a day (BID) | NASAL | 0 refills | Status: AC

## 2021-07-31 MED ORDER — METOCLOPRAMIDE HCL 5 MG/ML IJ SOLN
5.0000 mg | Freq: Once | INTRAMUSCULAR | Status: AC
  Administered 2021-07-31: 5 mg via INTRAVENOUS
  Filled 2021-07-31: qty 2

## 2021-07-31 MED ORDER — DIPHENHYDRAMINE HCL 50 MG/ML IJ SOLN
25.0000 mg | Freq: Once | INTRAMUSCULAR | Status: AC
  Administered 2021-07-31: 25 mg via INTRAVENOUS
  Filled 2021-07-31: qty 1

## 2021-07-31 MED ORDER — MECLIZINE HCL 25 MG PO TABS
25.0000 mg | ORAL_TABLET | Freq: Once | ORAL | Status: AC
  Administered 2021-07-31: 25 mg via ORAL
  Filled 2021-07-31: qty 1

## 2021-07-31 MED ORDER — CETIRIZINE HCL 10 MG PO TABS: 10.0000 mg | ORAL_TABLET | Freq: Every day | ORAL | 0 refills | Status: AC

## 2021-07-31 MED ORDER — SODIUM CHLORIDE 0.9 % IV BOLUS
1000.0000 mL | Freq: Once | INTRAVENOUS | Status: AC
  Administered 2021-07-31: 1000 mL via INTRAVENOUS

## 2021-07-31 NOTE — ED Provider Notes (Signed)
[facility] Provider Note   CSN: [id number] Arrival date & time: 07/31/21  1634     History Chief Complaint  Patient presents with   Dizziness   Nausea    Monica Kemp is a 50 y.o. female.  This is a 50 y.o. female with significant medical history as below, including migraine headache, vertigo who presents to the ED with complaint of dizziness, sinus pressure and congestion.  Patient is diagnosed with the flu, her symptoms have overall been improving over the past week however she is still having persistent intermittent headaches and lightheaded sensation, sinus pressure on the left.  She feels as though the lightheadedness sensation is different from her prior episodes of vertigo.  More of an imbalance sensation rather than a room spinning sensation.  Headache feels different from her prior headaches, localized to the left side of her face described as a pressure sensation.  Fullness to her left ear.  Not associated with numbness or tingling.  No nausea or vomiting, she has been tolerating oral intake without significant difficulty.  Taking home medications as prescribed.  No recent falls or trauma.  No fevers or chills the past 48 hours.  No IV drug use, no rashes.   The history is provided by the patient and the spouse. No language interpreter was used.  Dizziness Associated symptoms: headaches   Associated symptoms: no chest pain, no nausea, no palpitations, no shortness of breath and no tinnitus       Past Medical History:  Diagnosis Date   Arthritis    right knee   Complication of anesthesia    Headache    migraines   Meniere's disease    left ear   PONV (postoperative nausea and vomiting)    nauseated   Seizures ([place])    one time 15 years ago   UTI (lower urinary tract infection)     Patient Active Problem List   Diagnosis Date Noted   Neck pain 10/31/2015    Past Surgical History:  Procedure Laterality Date   CERVICAL DISC  ARTHROPLASTY Right 10/31/2015   Procedure: CERVICAL ANTERIOR [place] ARTHROPLASTY RIGHT C4 - C5;  Surgeon: Melina Schools, MD;  Location: [place];  Service: Orthopedics;  Laterality: Right;   CERVICAL DISC SURGERY     KNEE ARTHROSCOPY WITH MEDIAL MENISECTOMY Right 11/04/2018   Procedure: KNEE ARTHROSCOPY WITH PARTIAL  MEDIAL MENISECTOMY;  Surgeon: Rod Can, MD;  Location: [facility];  Service: Orthopedics;  Laterality: Right;   partial discectomy     TONSILLECTOMY       OB History   No obstetric history on file.     History reviewed. No pertinent family history.  Social History   Tobacco Use   Smoking status: Former    Types: Cigarettes    Quit date: 10/22/2003    Years since quitting: 17.7   Smokeless tobacco: Never  Vaping Use   Vaping Use: Never used  Substance Use Topics   Alcohol use: No   Drug use: No    Home Medications Prior to Admission medications   Medication Sig Start Date End Date Taking? Authorizing Provider  cetirizine (ZYRTEC ALLERGY) 10 MG tablet Take 1 tablet (10 mg total) by mouth daily for 14 days. 07/31/21 08/14/21 Yes Jeanell Sparrow, DO  oxymetazoline (AFRIN NASAL SPRAY) 0.05 % nasal spray Place 1 spray into both nostrils 2 (two) times daily for 3 days. 07/31/21 08/03/21 Yes Wynona Dove A, DO  pseudoephedrine (SUDAFED 12 HOUR) 120  MG 12 hr tablet Take 1 tablet (120 mg total) by mouth 2 (two) times daily for 5 days. 07/31/21 08/05/21 Yes Jeanell Sparrow, DO  Ascorbic Acid (VITAMIN C) 500 MG CAPS See admin instructions.    [provider]  Calcium Carb-Cholecalciferol (CALCIUM 500 + D) 500-125 MG-UNIT TABS 1 tablet with a meal    [provider]  Cholecalciferol (VITAMIN D3) 125 MCG (5000 UT) TABS See admin instructions.    [provider]  cimetidine (TAGAMET) 800 MG tablet Take 1 tablet (800 mg total) by mouth 3 (three) times daily. 05/01/21   Sheffield, Ronalee Red, PA-C  Cyanocobalamin (VITAMIN B12) 1000 MCG TBCR 1 tablet    [provider]  diclofenac (VOLTAREN) 75 MG EC tablet diclofenac sodium 75 mg tablet,delayed release  TAKE 1 TABLET BY MOUTH TWICE DAILY    [provider]  docusate sodium (COLACE) 100 MG capsule Take 1 capsule (100 mg total) by mouth 2 (two) times daily. 11/04/18   Swinteck, Aaron Edelman, MD  doxycycline (VIBRAMYCIN) 100 MG capsule Take 1 capsule (100 mg total) by mouth 2 (two) times daily. 07/16/21   Raspet, Derry Skill, PA-C  estradiol (VIVELLE-DOT) 0.075 MG/24HR Place 1 patch onto the skin 2 (two) times a week. Changes on Monday and Thursday    [provider]  estradiol (VIVELLE-DOT) 0.075 MG/24HR estradiol 0.075 mg/24 hr semiweekly transdermal patch  APP 1 PA EXT TO THE SKIN 2 TIMES A WK    [provider]  fluticasone (FLONASE) 50 MCG/ACT nasal spray Place 1 spray into both nostrils daily.    [provider]  magnesium oxide (MAG-OX) 400 (240 Mg) MG tablet magnesium oxide 400 mg (241.3 mg magnesium) tablet   400 mg by oral route.    [provider]  omega-3 acid ethyl esters (LOVAZA) 1 g capsule Take 1,600 mg by mouth daily.    [provider]  podofilox (CONDYLOX) 0.5 % gel Apply topically as directed. Apply medication to AA for 3 days. Take 4 days off.  Repeat weekly x up to 4 weeks. If too much irritation, apply less often. 05/01/21   Sheffield, Ronalee Red, PA-C  Probiotic TBEC See admin instructions.    [provider]  progesterone (PROMETRIUM) 100 MG capsule progesterone micronized 100 mg capsule  TK 1 C PO HS    [provider]  vitamin C (ASCORBIC ACID) 500 MG tablet Take 240 mg by mouth daily.    [provider]    Allergies    Reglan [metoclopramide], Buprenorphine hcl, Ceftin [cefuroxime axetil], Codeine, Hydrocodone, Morphine and related, and Sulfa antibiotics  Review of Systems   Review of Systems  Constitutional:  Negative for activity change and fever.  HENT:  Positive for congestion, sinus pressure and  sinus pain. Negative for facial swelling, sore throat, tinnitus and trouble swallowing.   Eyes:  Negative for discharge and redness.  Respiratory:  Negative for cough and shortness of breath.   Cardiovascular:  Negative for chest pain and palpitations.  Gastrointestinal:  Negative for abdominal pain and nausea.  Genitourinary:  Negative for dysuria and flank pain.  Musculoskeletal:  Negative for back pain and gait problem.  Skin:  Negative for pallor and rash.  Neurological:  Positive for dizziness and headaches. Negative for syncope.   Physical Exam Updated Vital Signs BP (!) 146/84   Pulse 90   Temp 98.3 F (36.8 C) (Oral)   Resp 16   Ht 5' 9"  (1.753 m)   Wt  82.6 kg   LMP 08/25/2011   SpO2 100%   BMI 26.88 kg/m   Physical Exam Vitals and nursing note reviewed.  Constitutional:      General: She is not in acute distress.    Appearance: Normal appearance.  HENT:     Head: Normocephalic and atraumatic.     Comments: Mild pain on percussion of frontal/maxillary sinuses     Right Ear: External ear normal. No mastoid tenderness.     Left Ear: External ear normal. No mastoid tenderness.     Ears:     Comments: Small amount of fluid behind TM.  Clear.  No erythema.  No pain to pinna.    Nose: Nose normal.     Mouth/Throat:     Mouth: Mucous membranes are moist.  Eyes:     General: No scleral icterus.       Right eye: No discharge.        Left eye: No discharge.     Extraocular Movements: Extraocular movements intact.     Pupils: Pupils are equal, round, and reactive to light.  Cardiovascular:     Rate and Rhythm: Normal rate and regular rhythm.     Pulses: Normal pulses.     Heart sounds: Normal heart sounds.  Pulmonary:     Effort: Pulmonary effort is normal. No respiratory distress.     Breath sounds: Normal breath sounds.  Abdominal:     General: Abdomen is flat.     Tenderness: There is no abdominal tenderness.  Musculoskeletal:        General: Normal range of  motion.     Cervical back: Normal range of motion.     Right lower leg: No edema.     Left lower leg: No edema.  Skin:    General: Skin is warm and dry.     Capillary Refill: Capillary refill takes less than 2 seconds.  Neurological:     Mental Status: She is alert and oriented to person, place, and time.     Cranial Nerves: Cranial nerves 2-12 are intact. No dysarthria or facial asymmetry.     Sensory: Sensation is intact. No sensory deficit.     Motor: Motor function is intact. No tremor or pronator drift.     Coordination: Coordination is intact. Finger-Nose-Finger Test normal.     Gait: Gait is intact.  Psychiatric:        Mood and Affect: Mood normal.        Behavior: Behavior normal.    ED Results / Procedures / Treatments   Labs (all labs ordered are listed, but only abnormal results are displayed) Labs Reviewed  CBC WITH DIFFERENTIAL/PLATELET  BASIC METABOLIC PANEL  MAGNESIUM    EKG None  Radiology CT Head Wo Contrast  Result Date: 07/31/2021 CLINICAL DATA:  Headache, dizziness and nausea. EXAM: CT HEAD WITHOUT CONTRAST TECHNIQUE: Contiguous axial images were obtained from the base of the skull through the vertex without intravenous contrast. COMPARISON:  MRI brain 04/01/2013 FINDINGS: Brain: No evidence of acute infarction, hemorrhage, hydrocephalus, extra-axial collection or mass lesion/mass effect. Vascular: No hyperdense vessel or unexpected calcification. Skull: Normal. Negative for fracture or focal lesion. Sinuses/Orbits: No acute finding. Other: None. IMPRESSION: No acute intracranial CT findings or interval changes. Electronically Signed   By: Telford Nab M.D.   On: 07/31/2021 20:25    Procedures Procedures   Medications Ordered in ED Medications  sodium chloride 0.9 % bolus 1,000 mL (0 mLs Intravenous Stopped 07/31/21  2002)  meclizine (ANTIVERT) tablet 25 mg (25 mg Oral Given 07/31/21 1854)  metoCLOPramide (REGLAN) injection 5 mg (5 mg Intravenous  Given 07/31/21 1901)  diphenhydrAMINE (BENADRYL) injection 25 mg (25 mg Intravenous Given 07/31/21 1900)    ED Course  I have reviewed the triage vital signs and the nursing notes.  Pertinent labs & imaging results that were available during my care of the patient were reviewed by me and considered in my medical decision making (see chart for details).    MDM Rules/Calculators/A&P                            CC: ha, dizzy  This patient complains of above; this involves an extensive number of treatment options and is a complaint that carries with it a high risk of complications and morbidity. Vital signs were reviewed. Serious etiologies considered.  Record review:   Previous records obtained and reviewed   Additional history obtained from spouse  Work up as above, notable for:  Labs & imaging results that were available during my care of the patient were reviewed by me and considered in my medical decision making.   I ordered imaging studies which included CTH and I independently visualized and interpreted imaging which showed no acute process  Management: Patient with headache, given Reglan, Benadryl, IV fluids.  Patient did have a possible reaction to the Reglan,, possible dystonic reaction.  Allergy list updated.  SHe was given Benadryl.  Upon reassessment symptoms have resolved and patient is feeling better.  Reassessment:  Patient reports that she is feeling better.  She is not lightheaded, not having a headache.  Neurologic exam is nonfocal.  Patient with a small amount of fluid behind her TM on the left.  Concern for sinusitis, sinus congestion as etiology patient symptoms today.  Discussed antihistamine, decongestant nasal spray for maximum 3 days, oral decongestant.  Increase oral fluid intake.  Recommend to follow-up with a PCP.  The patient improved significantly and was discharged in stable condition. Detailed discussions were had with the patient regarding current  findings, and need for close f/u with PCP or on call doctor. The patient has been instructed to return immediately if the symptoms worsen in any way for re-evaluation. Patient verbalized understanding and is in agreement with current care plan. All questions answered prior to discharge.           This chart was dictated using voice recognition software.  Despite best efforts to proofread,  errors can occur which can change the documentation meaning.  Final Clinical Impression(s) / ED Diagnoses Final diagnoses:  Sinusitis, unspecified chronicity, unspecified location    Rx / DC Orders ED Discharge Orders          Ordered    oxymetazoline (AFRIN NASAL SPRAY) 0.05 % nasal spray  2 times daily        07/31/21 2103    cetirizine (ZYRTEC ALLERGY) 10 MG tablet  Daily        07/31/21 2103    pseudoephedrine (SUDAFED 12 HOUR) 120 MG 12 hr tablet  2 times daily        07/31/21 2103             Jeanell Sparrow, DO 07/31/21 2107

## 2021-07-31 NOTE — ED Triage Notes (Signed)
Reports she has had the flu for the last week.  Reports having dizziness and nausea since before being diagnosed with the flu.

## 2021-07-31 NOTE — ED Notes (Signed)
Called CT to let them know patient is ready for scan

## 2021-07-31 NOTE — ED Notes (Signed)
Pt began feeling "woozy" and having heart palpitations after receiving 22m Reglan. No distress at this time. RR even and unlabored, speaking complete sentences   No hives, swelling or redness noted. Will continue to monitor

## 2021-08-05 ENCOUNTER — Other Ambulatory Visit: Payer: Self-pay

## 2021-08-05 ENCOUNTER — Encounter (HOSPITAL_COMMUNITY): Payer: Self-pay

## 2021-08-05 ENCOUNTER — Emergency Department (HOSPITAL_COMMUNITY)
Admission: EM | Admit: 2021-08-05 | Discharge: 2021-08-05 | Disposition: A | Discharge status: Home / Self Care | Payer: 59 | Attending: Emergency Medicine | Admitting: Emergency Medicine

## 2021-08-05 DIAGNOSIS — R109 Unspecified abdominal pain: Secondary | ICD-10-CM

## 2021-08-05 DIAGNOSIS — R1032 Left lower quadrant pain: Secondary | ICD-10-CM | POA: Diagnosis not present

## 2021-08-05 DIAGNOSIS — Z5321 Procedure and treatment not carried out due to patient leaving prior to being seen by health care provider: Secondary | ICD-10-CM | POA: Diagnosis not present

## 2021-08-05 DIAGNOSIS — R197 Diarrhea, unspecified: Secondary | ICD-10-CM | POA: Diagnosis not present

## 2021-08-05 DIAGNOSIS — R110 Nausea: Secondary | ICD-10-CM | POA: Diagnosis not present

## 2021-08-05 DIAGNOSIS — M545 Low back pain, unspecified: Secondary | ICD-10-CM | POA: Diagnosis not present

## 2021-08-05 LAB — COMPREHENSIVE METABOLIC PANEL
ALT: 35 U/L (ref 0–44)
AST: 24 U/L (ref 15–41)
Albumin: 4.8 g/dL (ref 3.5–5.0)
Alkaline Phosphatase: 76 U/L (ref 38–126)
Anion gap: 7 (ref 5–15)
BUN: 7 mg/dL (ref 6–20)
CO2: 24 mmol/L (ref 22–32)
Calcium: 9.5 mg/dL (ref 8.9–10.3)
Chloride: 108 mmol/L (ref 98–111)
Creatinine, Ser: 0.94 mg/dL (ref 0.44–1.00)
GFR, Estimated: >60 mL/min (ref >60)
Glucose, Bld: 98 mg/dL (ref 70–99)
Potassium: 3.9 mmol/L (ref 3.5–5.1)
Sodium: 139 mmol/L (ref 135–145)
Total Bilirubin: 2.5 mg/dL — ABNORMAL HIGH (ref 0.3–1.2)
Total Protein: 7.9 g/dL (ref 6.5–8.1)

## 2021-08-05 LAB — URINALYSIS, ROUTINE W REFLEX MICROSCOPIC
Bilirubin Urine: NEGATIVE
Glucose, UA: NEGATIVE mg/dL
Hgb urine dipstick: NEGATIVE
Ketones, ur: NEGATIVE mg/dL
Leukocytes,Ua: NEGATIVE
Nitrite: NEGATIVE
Protein, ur: NEGATIVE mg/dL
Specific Gravity, Urine: 1.004 — ABNORMAL LOW (ref 1.005–1.030)
pH: 5 (ref 5.0–8.0)

## 2021-08-05 LAB — CBC
HCT: 43.9 % (ref 36.0–46.0)
Hemoglobin: 15.1 g/dL — ABNORMAL HIGH (ref 12.0–15.0)
MCH: 32.1 pg (ref 26.0–34.0)
MCHC: 34.4 g/dL (ref 30.0–36.0)
MCV: 93.4 fL (ref 80.0–100.0)
Platelets: 272 10*3/uL (ref 150–400)
RBC: 4.7 MIL/uL (ref 3.87–5.11)
RDW: 11.8 % (ref 11.5–15.5)
WBC: 7.2 10*3/uL (ref 4.0–10.5)
nRBC: 0 % (ref 0.0–0.2)

## 2021-08-05 LAB — LIPASE, BLOOD: Lipase: 37 U/L (ref 11–51)

## 2021-08-05 LAB — I-STAT BETA HCG BLOOD, ED (MC, WL, AP ONLY): I-stat hCG, quantitative: <5 m[IU]/mL (ref <5)

## 2021-08-05 NOTE — ED Provider Notes (Signed)
Emergency Medicine Provider Triage Evaluation Note  Monica Kemp , a 50 y.o. female  was evaluated in triage.  Pt complains of abdominal pain and diarrhea.  Patient states she has been having symptoms for at least a week now.  She recently also had symptoms with dizziness sinusitis URI type symptoms.  Patient had been having pain in her left flank area.  She ended up following up with urologist.  While there she had a CT scan.  Patient was instructed to follow-up with a GI doctor.  Patient states she called that office today and was told to come to the ED where there were on-call GI doctors.  Review of Systems  Positive: Frequent diarrhea yellow stools Negative: No fever  Physical Exam  BP (!) 136/94 (BP Location: Left Arm)   Pulse 86   Temp 98 F (36.7 C) (Oral)   Resp 16   Ht 1.753 m (5' 9" )   Wt 82.6 kg   LMP 08/25/2011   SpO2 100%   BMI 26.88 kg/m  Gen:   Awake, no distress   Resp:  Normal effort  MSK:   Moves extremities without difficulty  Other:  Abdomen soft nontender   Medical Decision Making  Medically screening exam initiated at 11:04 AM.  Appropriate orders placed.  Monica Kemp was informed that the remainder of the evaluation will be completed by another provider, this initial triage assessment does not replace that evaluation, and the importance of remaining in the ED until their evaluation is complete. Clinical Course as of 08/05/21 1106  Mon Aug 05, 2021  1103 CT scan on 12/3  IMPRESSION: No acute findings in the abdomen or pelvis. Specifically, no evidence of obstructive uropathy.   [JK]    Clinical Course User Index [JK] Dorie Rank, MD   We will check laboratory test.  CT scan reviewed from the urology office.  No acute findings were noted   Dorie Rank, MD 08/05/21 1106

## 2021-08-05 NOTE — ED Triage Notes (Signed)
Patient c/o LLQ abdominal pain that radiates into the left lower back, diarrhea, and nausea. Patient went to [facility]  3 days ago and states her symptoms worsened over the weekend.

## 2021-08-08 ENCOUNTER — Telehealth: Payer: Self-pay | Admitting: "Endocrinology

## 2021-08-08 NOTE — Telephone Encounter (Signed)
Received referral on pt from [facility] for hormonal issues. Closed referral and notified this office via fax, as we do no see for this DX

## 2022-04-04 ENCOUNTER — Encounter: Payer: Self-pay | Admitting: Physician Assistant

## 2022-04-07 ENCOUNTER — Telehealth: Payer: Self-pay | Admitting: *Deleted

## 2022-04-07 DIAGNOSIS — A630 Anogenital (venereal) warts: Secondary | ICD-10-CM

## 2022-04-07 MED ORDER — CIMETIDINE 800 MG PO TABS: 800.0000 mg | ORAL_TABLET | Freq: Three times a day (TID) | ORAL | 6 refills | Status: AC

## 2022-04-07 NOTE — Telephone Encounter (Signed)
Patient needed refills - okay to refill per kelli sheffield- sent to patient pharmacy.

## 2022-05-01 ENCOUNTER — Ambulatory Visit: Payer: 59 | Admitting: Physician Assistant

## 2022-05-10 IMAGING — CT CT HEAD W/O CM
4 series · 17 of 47 positions shown, 19 images · non-contrast
Comparison: MRI brain 04/01/2013

CLINICAL DATA: Headache, dizziness and nausea.

EXAM:
CT HEAD WITHOUT CONTRAST
TECHNIQUE: Contiguous axial images were obtained from the base of the skull
through the vertex without intravenous contrast.

[Series 2: head bone · axial · 0.46mm/px · z∈[-82,-26]mm · 4 of 80 slices shown]
[im 8/80  bone]
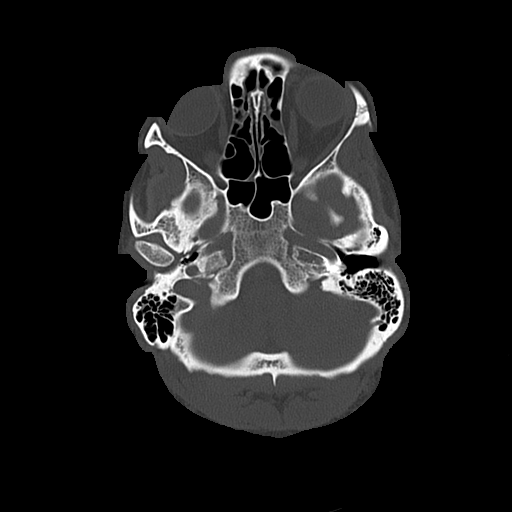
[im 16/80  bone]
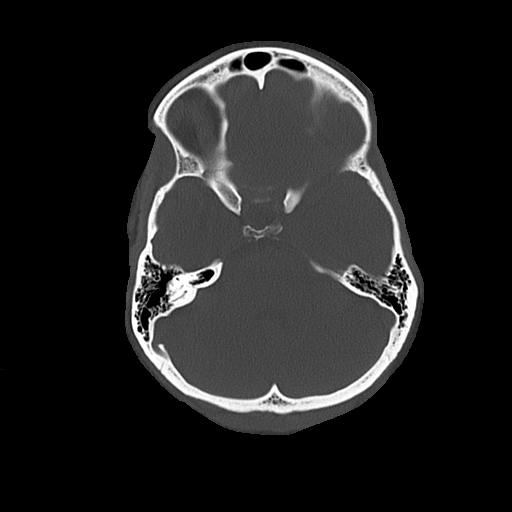
[im 24/80  bone]
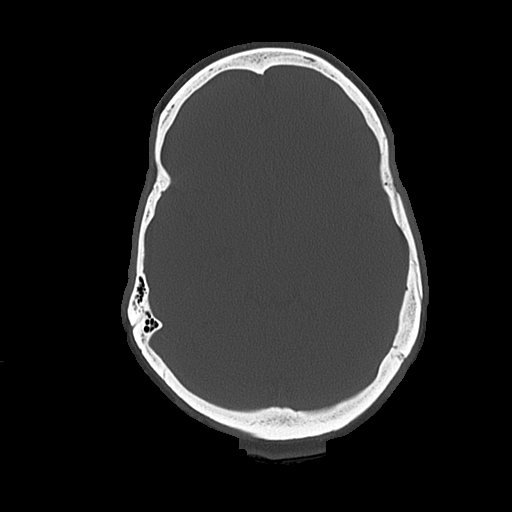
[im 36/80  bone]
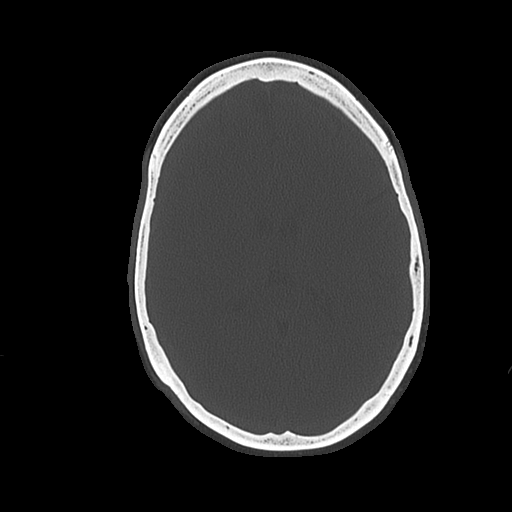

[Series 3: head wo · axial · 0.46mm/px · z∈[-81,+39]mm · 7 of 32 slices shown, 9 images]
[im 4/32  brain]
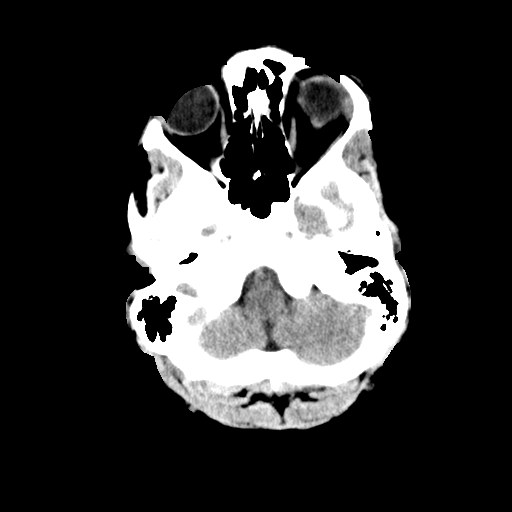
[im 4/32  bone]
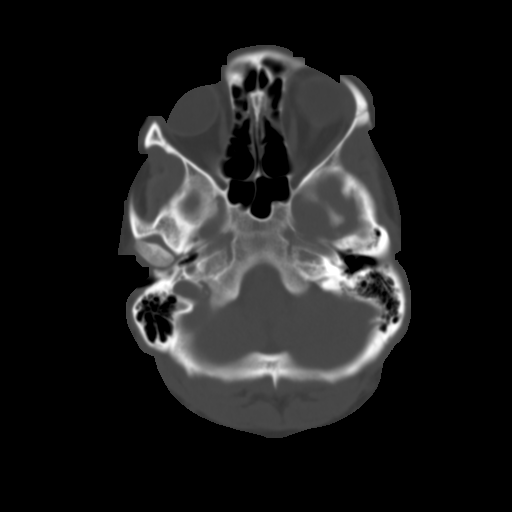
[im 8/32  brain]
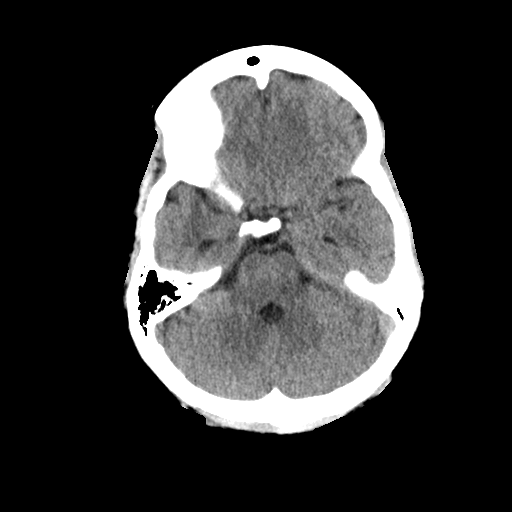
[im 12/32  brain]
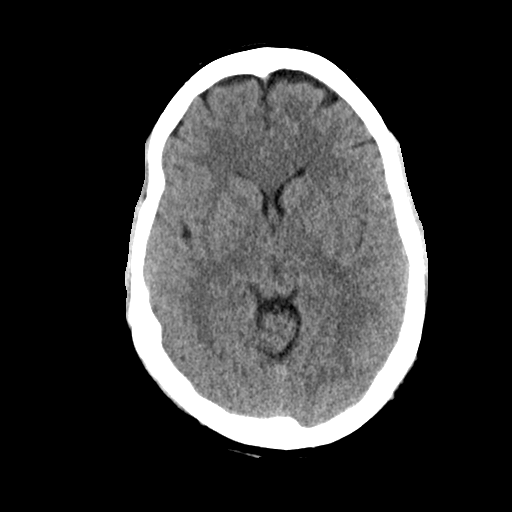
[im 16/32  brain]
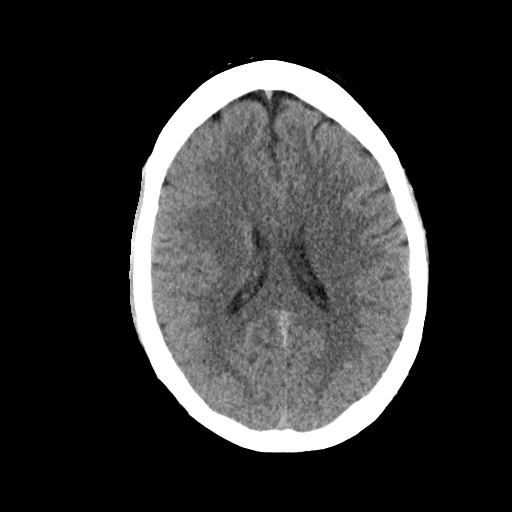
[im 20/32  brain]
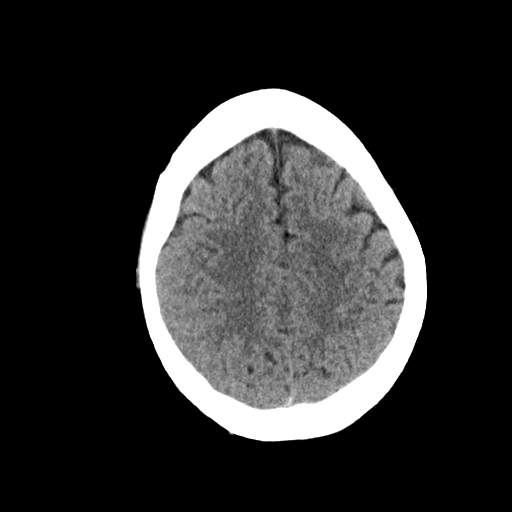
[im 20/32  bone]
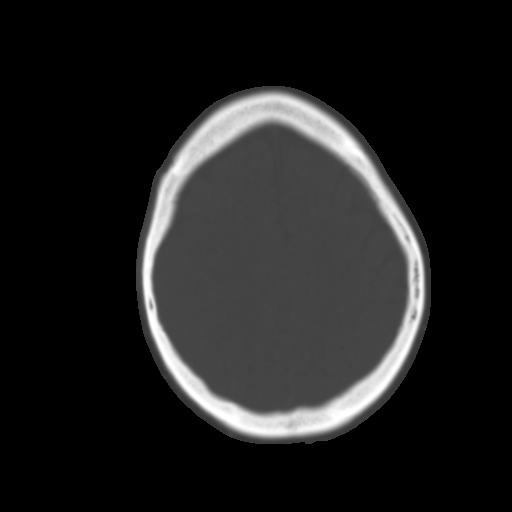
[im 24/32  brain]
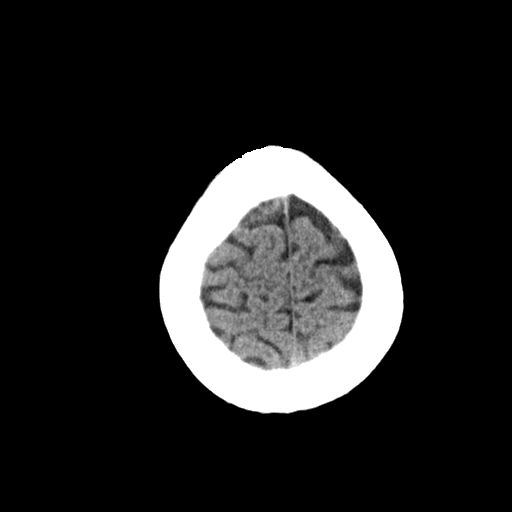
[im 28/32  brain]
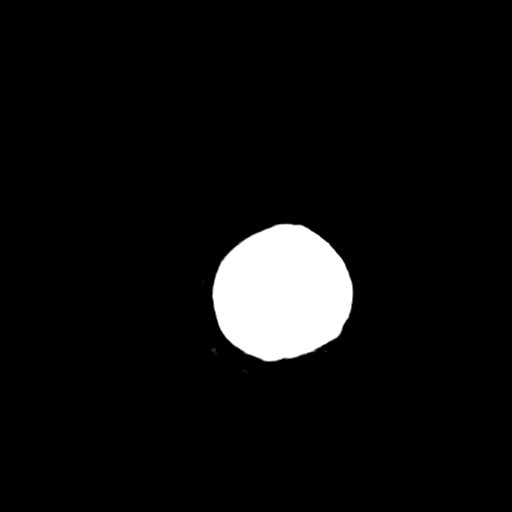

[Series 4: coronal soft · coronal · 0.30mm/px · 3 of 62 slices shown]
[im 21/62  brain]
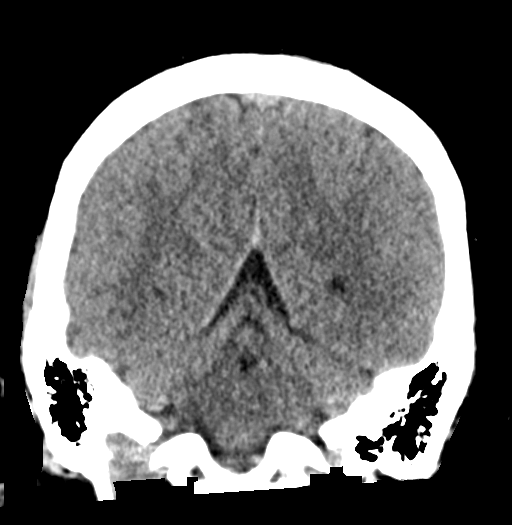
[im 28/62  brain]
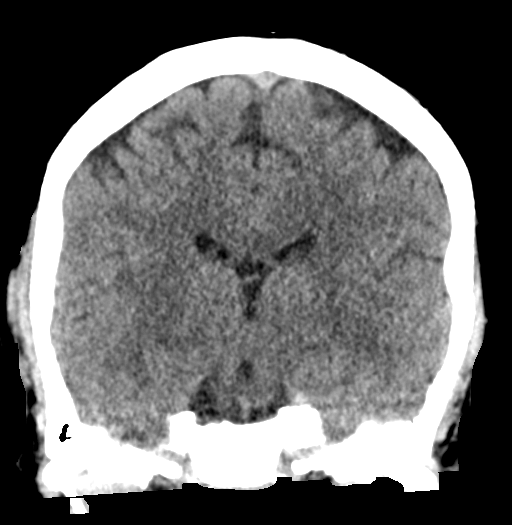
[im 34/62  brain]
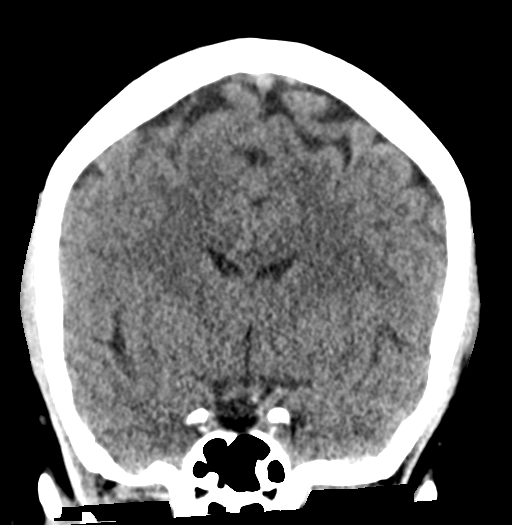

[Series 5: sagittal soft · sagittal · 0.31mm/px · 3 of 52 slices shown]
[im 18/52  brain]
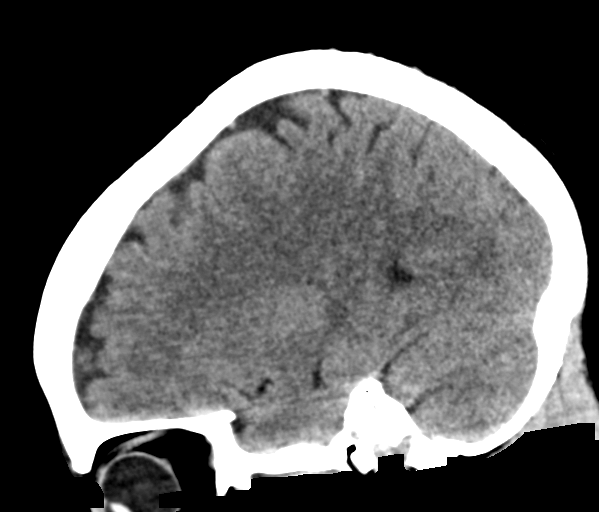
[im 26/52  brain]
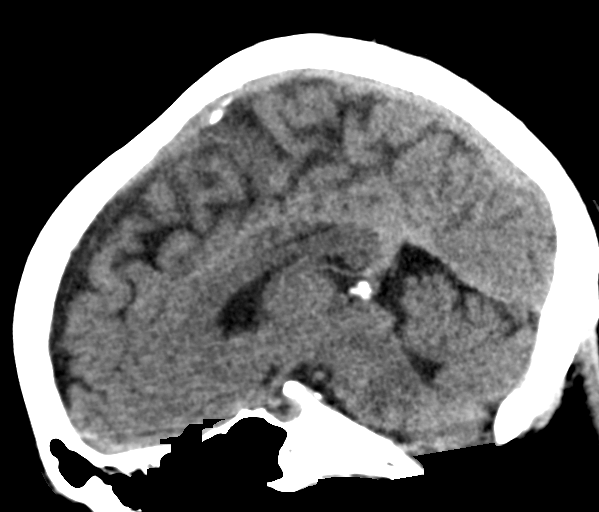
[im 35/52  brain]
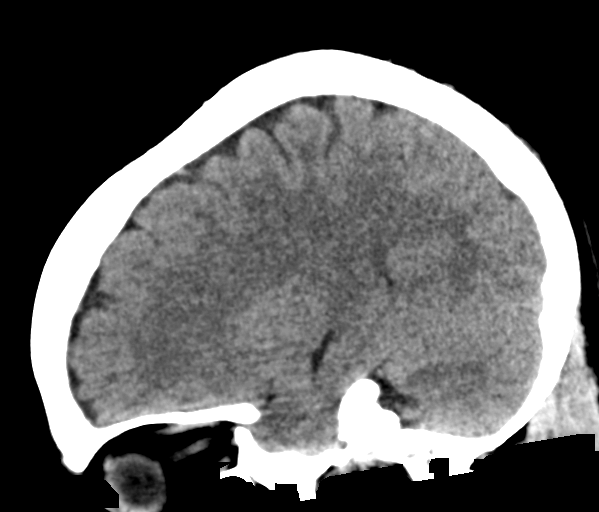

[17 of 47 positions shown; findings below may reference images not displayed]

FINDINGS: Brain: No evidence of acute infarction, hemorrhage, hydrocephalus,
extra-axial collection or mass lesion/mass effect.

Vascular: No hyperdense vessel or unexpected calcification.

Skull: Normal. Negative for fracture or focal lesion.

Sinuses/Orbits: No acute finding.

Other: None.
IMPRESSION: No acute intracranial CT findings or interval changes.
